# Patient Record
Sex: Female | Born: 1955 | Race: White | Hispanic: No | Marital: Married | Smoking: Never smoker
Health system: Southern US, Community
[De-identification: ages and names within clinical notes are randomized; demographics above are authoritative.]

## PROBLEM LIST (undated history)

## (undated) DIAGNOSIS — D329 Benign neoplasm of meninges, unspecified: Secondary | ICD-10-CM

## (undated) DIAGNOSIS — I10 Essential (primary) hypertension: Secondary | ICD-10-CM

## (undated) HISTORY — DX: Essential (primary) hypertension: I10

## (undated) HISTORY — DX: Benign neoplasm of meninges, unspecified: D32.9

## (undated) HISTORY — PX: WISDOM TOOTH EXTRACTION: SHX21

---

## 1994-06-06 HISTORY — PX: PARTIAL HYSTERECTOMY: SHX80

## 2004-06-06 DIAGNOSIS — D333 Benign neoplasm of cranial nerves: Secondary | ICD-10-CM | POA: Insufficient documentation

## 2004-06-06 HISTORY — DX: Benign neoplasm of cranial nerves: D33.3

## 2004-06-06 HISTORY — PX: ACOUSTIC NEUROMA RESECTION: SHX5713

## 2015-06-07 HISTORY — PX: TOTAL KNEE ARTHROPLASTY: SHX125

## 2015-12-18 DIAGNOSIS — J309 Allergic rhinitis, unspecified: Secondary | ICD-10-CM

## 2015-12-18 DIAGNOSIS — F32A Depression, unspecified: Secondary | ICD-10-CM | POA: Insufficient documentation

## 2015-12-18 DIAGNOSIS — R42 Dizziness and giddiness: Secondary | ICD-10-CM | POA: Insufficient documentation

## 2015-12-18 HISTORY — DX: Depression, unspecified: F32.A

## 2015-12-18 HISTORY — DX: Allergic rhinitis, unspecified: J30.9

## 2015-12-18 HISTORY — DX: Dizziness and giddiness: R42

## 2017-04-21 DIAGNOSIS — M1990 Unspecified osteoarthritis, unspecified site: Secondary | ICD-10-CM

## 2017-04-21 HISTORY — DX: Unspecified osteoarthritis, unspecified site: M19.90

## 2021-07-23 DIAGNOSIS — I361 Nonrheumatic tricuspid (valve) insufficiency: Secondary | ICD-10-CM

## 2021-08-25 NOTE — Progress Notes (Signed)
?Cardiology Office Note:   ? ?Date:  08/26/2021  ? ?ID:  Alyssa Ortiz, DOB 1956/01/10, MRN [id number] ? ?PCP:  Earlyne Iba, NP  ?Cardiologist:  Shirlee More, MD  ? ?Referring MD: Earlyne Iba, NP ? ?ASSESSMENT:   ? ?1. Bilateral lower extremity edema   ?2. Idiopathic pericardial effusion   ?3. Essential hypertension   ?4. Hyperlipidemia, unspecified hyperlipidemia type   ?5. Pre-procedure lab exam   ? ?PLAN:   ? ?In order of problems listed above: ? ?Clinical problem is lower extremity edema diagnosed as heart failure in the past not responsive to a loop diuretic.  Differential diagnosis includes heart failure diastolic, serial heart failure due to untreated sleep apnea or edema related to pericardial disease.  Clinically I do not think she has heart failure with normal BNP level and no characteristics of this at the time of her echocardiogram at [facility] to alleviate symptoms we will transition furosemide to torsemide I have asked her to proceed and have her CPAP titration and treatment to see if that resolves her peripheral edema and for evaluation of her idiopathic persistent pericardial effusion a cardiac MRI to define pericardial thickening or unsuspected cardiac infiltrative disease. ?Stable hypertension continue Ace inhibitor ?Continue her statin ? ?Next appointment 6 weeks ? ? ?Medication Adjustments/Labs and Tests Ordered: ?Current medicines are reviewed at length with the patient today.  Concerns regarding medicines are outlined above.  ?Orders Placed This Encounter  ?Procedures  ? MR CARDIAC MORPHOLOGY W WO CONTRAST  ? CBC with Differential/Platelet  ? EKG 12-Lead  ? ?Meds ordered this encounter  ?Medications  ? torsemide (DEMADEX) 20 MG tablet  ?  Sig: Take 1 tablet (20 mg total) by mouth daily.  ?  Dispense:  90 tablet  ?  Refill:  3  ?  ? ?Chief complaint: ?I have a history of heart failure and continue to have edema taking torsemide ? ?History of Present Illness:   ? ?Alyssa Ortiz is a 66 y.o. female with a history from chart review of hypertension and heart failure who is being seen today for the evaluation of heart failure at the request of Potts, Georgeann Oppenheim, NP.  Recent BNP level as an outpatient was quite low at 27.  She has a history of incidental meningioma and resection of acoustic neuroma in 2006 ? ?She had an echocardiogram [facility] 07/23/2021 small to moderate generalized pericardial effusion normal left ventricular systolic function mild LVH and grade 1 diastolic dysfunction. ? ?She had a treadmill stress test performed at [facility] 03/04/2019 which was normal EKG response 7.2 METS 92% of predicted heart rate and hypertensive blood pressure response ? ?She had an echocardiogram in 2 02/14/2019 showed mild LV dysfunction EF 45 to 50% with right coronary artery or left circumflex hypokinesia normal right ventricular size and function small pericardial effusion.  Pericardial effusion was noted as being persistent. ? ?In 2020 she had peripheral edema echocardiogram at [facility] and was told she had heart failure and left ventricular dysfunction.  She is not short of breath she has no known history of heart disease was put on a loop diuretic and it does not seem to be effective ? ?Recently she was having severe fatigue disordered sleep excessive daytime sleeping and was found to have obstructive sleep apnea is pending CPAP titration ? ?She has a idiopathic pericardial effusion small to moderate and no history of pericarditis pleuritis or rheumatologic disease ? ?She has no known heart disease congenital  rheumatic or atrial fibrillation ? ?Past Medical History:  ?Diagnosis Date  ? Hypertension   ? Meningioma ([place])   ? ? ?Past Surgical History:  ?Procedure Laterality Date  ? ACOUSTIC NEUROMA RESECTION  2006  ? PARTIAL HYSTERECTOMY  1996  ? TOTAL KNEE ARTHROPLASTY Right 2017  ? WISDOM TOOTH EXTRACTION    ? ? ?Current Medications: ?Current Meds  ?Medication Sig  ? aspirin EC 81 MG  tablet Take 81 mg by mouth daily. Swallow whole.  ? b complex vitamins capsule Take 1 capsule by mouth daily.  ? Cholecalciferol (D3-1000) 25 MCG (1000 UT) tablet Take 1,000 Units by mouth daily.  ? meloxicam (MOBIC) 15 MG tablet Take 15 mg by mouth daily as needed for pain.  ? potassium chloride (KLOR-CON) 10 MEQ tablet Take 10 mEq by mouth daily.  ? ramipril (ALTACE) 2.5 MG capsule Take 2.5 mg by mouth daily.  ? rosuvastatin (CRESTOR) 20 MG tablet Take 20 mg by mouth daily.  ? torsemide (DEMADEX) 20 MG tablet Take 1 tablet (20 mg total) by mouth daily.  ? VRAYLAR 1.5 MG capsule Take 1.5 mg by mouth daily.  ? [DISCONTINUED] furosemide (LASIX) 20 MG tablet Take 30 mg by mouth every morning.  ?  ? ?Allergies:   Patient has no known allergies.  ? ?Social History  ? ?Socioeconomic History  ? Marital status: Married  ?  Spouse name: Not on file  ? Number of children: Not on file  ? Years of education: Not on file  ? Highest education level: Not on file  ?Occupational History  ? Not on file  ?Tobacco Use  ? Smoking status: Never  ?  Passive exposure: Past  ? Smokeless tobacco: Never  ?Vaping Use  ? Vaping Use: Never used  ?Substance and Sexual Activity  ? Alcohol use: Yes  ? Drug use: Never  ? Sexual activity: Not on file  ?Other Topics Concern  ? Not on file  ?Social History Narrative  ? Not on file  ? ?Social Determinants of Health  ? ?Financial Resource Strain: Not on file  ?Food Insecurity: Not on file  ?Transportation Needs: Not on file  ?Physical Activity: Not on file  ?Stress: Not on file  ?Social Connections: Not on file  ?  ? ?Family History: ?The patient's family history includes Alzheimer's disease in her mother; Congestive Heart Failure in her maternal grandfather; Healthy in her father; Heart Problems in her maternal grandmother; Liver cancer in her paternal grandfather; Other (age of onset: 16) in her paternal grandmother. ? ?ROS:   ?ROS Please see the history of present illness.    ? All other systems  reviewed and are negative. ? ?EKGs/Labs/Other Studies Reviewed:   ? ?The following studies were reviewed today: ? ? ?EKG:  EKG is  ordered today.  The ekg ordered today is personally reviewed and demonstrates sinus rhythm late transition precordial leads otherwise normal EKG does not have low voltage no pattern of infarction or ischemia ? ?Recent Labs: ?07/21/2021 ?Cholesterol 172 LDL 89 triglycerides 87 HDL 67 A1c 5.6% hemoglobin 14.4 creatinine 0.85 potassium 3.9 ? ? ?Physical Exam:   ? ?VS:  BP 124/78 (BP Location: Right Arm)   Pulse 72   Ht '5\' 7"'$  (1.702 m)   Wt 228 lb 9.6 oz (103.7 kg)   SpO2 98%   BMI 35.80 kg/m?    ? ?Wt Readings from Last 3 Encounters:  ?08/26/21 228 lb 9.6 oz (103.7 kg)  ?  ? ?GEN: Obese BMI greater  than 35 she does not look chronically ill well nourished, well developed in no acute distress ?HEENT: Normal ?NECK: No JVD; No carotid bruits ?LYMPHATICS: No lymphadenopathy ?CARDIAC: RRR, no murmurs, rubs, gallops ?RESPIRATORY:  Clear to auscultation without rales, wheezing or rhonchi  ?ABDOMEN: Soft, non-tender, non-distended ?MUSCULOSKELETAL:  No edema; No deformity  ?SKIN: Warm and dry ?NEUROLOGIC:  Alert and oriented x 3 ?PSYCHIATRIC:  Normal affect  ? ? ? ?Signed, ?Shirlee More, MD  ?08/26/2021 5:22 PM    ?[place] ?

## 2021-08-26 ENCOUNTER — Other Ambulatory Visit: Payer: Self-pay

## 2021-08-26 ENCOUNTER — Ambulatory Visit: Payer: Federal, State, Local not specified - PPO | Admitting: Cardiology

## 2021-08-26 ENCOUNTER — Encounter: Payer: Self-pay | Admitting: Cardiology

## 2021-08-26 VITALS — BP 124/78 | HR 72 | Ht 67.0 in | Wt 228.6 lb

## 2021-08-26 DIAGNOSIS — I10 Essential (primary) hypertension: Secondary | ICD-10-CM

## 2021-08-26 DIAGNOSIS — I3139 Other pericardial effusion (noninflammatory): Secondary | ICD-10-CM

## 2021-08-26 DIAGNOSIS — Z01812 Encounter for preprocedural laboratory examination: Secondary | ICD-10-CM

## 2021-08-26 DIAGNOSIS — E785 Hyperlipidemia, unspecified: Secondary | ICD-10-CM | POA: Diagnosis not present

## 2021-08-26 DIAGNOSIS — R600 Localized edema: Secondary | ICD-10-CM

## 2021-08-26 HISTORY — DX: Other pericardial effusion (noninflammatory): I31.39

## 2021-08-26 MED ORDER — TORSEMIDE 20 MG PO TABS: 20.0000 mg | ORAL_TABLET | Freq: Every day | ORAL | 3 refills | Status: DC

## 2021-08-26 NOTE — Patient Instructions (Addendum)
Medication Instructions:  ?Your physician has recommended you make the following change in your medication:  ?Discontinue your Furosemide ?Start Torsemide 20 mg once daily ? ?*If you need a refill on your cardiac medications before your next appointment, please call your pharmacy* ? ? ?Lab Work: ?Your physician recommends that you return for lab work in: Today for [place] ? ?If you have labs (blood work) drawn today and your tests are completely normal, you will receive your results only by: ?MyChart Message (if you have MyChart) OR ?A paper copy in the mail ?If you have any lab test that is abnormal or we need to change your treatment, we will call you to review the results. ? ? ?Testing/Procedures: ??  ?[facility]  ?[address]  ?[place], [place] [address]  ?[phone]  ?Proceed to the [facility] Radiology Department (First Floor).  ??  ?Magnetic resonance imaging (MRI) is a painless test that produces images of the inside of the body without using X-rays. During an MRI, strong magnets and radio waves work together in a [employer], political party to form detailed images. MRI images may provide more details about a medical condition than X-rays, CT scans, and ultrasounds can provide.  ?You may be given earphones to listen for instructions.  ?You may eat a light breakfast and take medications as ordered with the exception of Torsemide (fluid pill, other). ?If a contrast material will be used, an IV will be inserted into one of your veins. Contrast material will be injected into your IV.  ?You will be asked to remove all metal, including: Watch, jewelry, and other metal objects including hearing aids, hair pieces and dentures. (Braces and fillings normally are not a problem.)  ?If contrast material was used:  ?It will leave your body through your urine within a day. You may be told to drink plenty of fluids to help flush the contrast material out of your system.  ?TEST WILL TAKE APPROXIMATELY 1 HOUR  ?PLEASE  NOTIFY SCHEDULING AT LEAST 24 HOURS IN ADVANCE IF YOU ARE UNABLE TO KEEP YOUR APPOINTMENT.   ? ? ?Follow-Up: ?At [facility], you and your health needs are our priority.  As part of our continuing mission to provide you with exceptional heart care, we have created designated Provider Care Teams.  These Care Teams include your primary Cardiologist (physician) and Advanced Practice Providers (APPs -  Physician Assistants and Nurse Practitioners) who all work together to provide you with the care you need, when you need it. ? ?We recommend signing up for the patient portal called "MyChart".  Sign up information is provided on this After Visit Summary.  MyChart is used to connect with patients for Virtual Visits (Telemedicine).  Patients are able to view lab/test results, encounter notes, upcoming appointments, etc.  Non-urgent messages can be sent to your provider as well.   ?To learn more about what you can do with MyChart, go to [email or web address].   ? ?Your next appointment:   ?8 week(s) ? ?The format for your next appointment:   ?In Person ? ?Provider:   ?Shirlee More, MD  ? ? ?Other Instructions ?  ?

## 2021-08-27 ENCOUNTER — Telehealth: Payer: Self-pay | Admitting: *Deleted

## 2021-08-27 LAB — CBC WITH DIFFERENTIAL/PLATELET
Basophils Absolute: 0.1 10*3/uL (ref 0.0–0.2)
Basos: 1 %
EOS (ABSOLUTE): 0.1 10*3/uL (ref 0.0–0.4)
Eos: 2 %
Hematocrit: 43.3 % (ref 34.0–46.6)
Hemoglobin: 14.4 g/dL (ref 11.1–15.9)
Immature Grans (Abs): 0 10*3/uL (ref 0.0–0.1)
Immature Granulocytes: 0 %
Lymphocytes Absolute: 1.8 10*3/uL (ref 0.7–3.1)
Lymphs: 33 %
MCH: 29.9 pg (ref 26.6–33.0)
MCHC: 33.3 g/dL (ref 31.5–35.7)
MCV: 90 fL (ref 79–97)
Monocytes Absolute: 0.4 10*3/uL (ref 0.1–0.9)
Monocytes: 8 %
Neutrophils Absolute: 2.9 10*3/uL (ref 1.4–7.0)
Neutrophils: 56 %
Platelets: 229 10*3/uL (ref 150–450)
RBC: 4.81 x10E6/uL (ref 3.77–5.28)
RDW: 11.8 % (ref 11.7–15.4)
WBC: 5.2 10*3/uL (ref 3.4–10.8)

## 2021-08-27 NOTE — Telephone Encounter (Signed)
Informed pt of normal lab results. She verbalized understanding. ?

## 2021-08-27 NOTE — Telephone Encounter (Signed)
-----   Message from Richardo Priest, MD sent at 08/27/2021  7:34 AM EDT ----- ?Normal or stable result ?

## 2021-09-16 ENCOUNTER — Telehealth (HOSPITAL_COMMUNITY): Payer: Self-pay | Admitting: *Deleted

## 2021-09-16 NOTE — Telephone Encounter (Signed)
Reaching out to patient to offer assistance regarding upcoming cardiac imaging study; pt verbalizes understanding of appt date/time, parking situation and where to check in, and medications ordered, and verified current allergies; name and call back number provided for further questions should they arise ? ?Alyssa Clement RN Navigator Cardiac Imaging ?[facility] ?[phone] office ?[phone] cell ? ?Patient states she has ativan that she will take for the test and her husband will be bringing her. Reports a marker in the breast from a previous biopsy. ?

## 2021-09-16 NOTE — Telephone Encounter (Signed)
Attempted to call patient regarding upcoming cardiac MRI appointment. Left message on voicemail with name and callback number  Donica Derouin RN Navigator Cardiac Imaging [facility] [phone] Office [phone] Cell  

## 2021-09-17 ENCOUNTER — Ambulatory Visit (HOSPITAL_COMMUNITY)
Admission: RE | Admit: 2021-09-17 | Discharge: 2021-09-17 | Disposition: A | Discharge status: Home / Self Care | Payer: Federal, State, Local not specified - PPO | Source: Ambulatory Visit | Attending: Cardiology | Admitting: Cardiology

## 2021-09-17 DIAGNOSIS — I3139 Other pericardial effusion (noninflammatory): Secondary | ICD-10-CM

## 2021-09-17 MED ORDER — GADOBUTROL 1 MMOL/ML IV SOLN
10.0000 mL | Freq: Once | INTRAVENOUS | Status: AC | PRN
  Administered 2021-09-17: 10 mL via INTRAVENOUS

## 2021-09-22 DIAGNOSIS — D329 Benign neoplasm of meninges, unspecified: Secondary | ICD-10-CM | POA: Insufficient documentation

## 2021-09-22 DIAGNOSIS — I10 Essential (primary) hypertension: Secondary | ICD-10-CM | POA: Insufficient documentation

## 2021-09-23 ENCOUNTER — Encounter: Payer: Self-pay | Admitting: Cardiology

## 2021-09-23 ENCOUNTER — Telehealth: Payer: Self-pay | Admitting: Physician Assistant

## 2021-09-23 ENCOUNTER — Ambulatory Visit: Payer: Federal, State, Local not specified - PPO | Admitting: Cardiology

## 2021-09-23 VITALS — BP 112/78 | HR 68 | Ht 67.0 in | Wt 226.1 lb

## 2021-09-23 DIAGNOSIS — I10 Essential (primary) hypertension: Secondary | ICD-10-CM

## 2021-09-23 DIAGNOSIS — Z01812 Encounter for preprocedural laboratory examination: Secondary | ICD-10-CM

## 2021-09-23 DIAGNOSIS — I3139 Other pericardial effusion (noninflammatory): Secondary | ICD-10-CM | POA: Diagnosis not present

## 2021-09-23 NOTE — Telephone Encounter (Signed)
Patient has a stable large pericardial effusion. Dr. Bettina Gavia spoke to Dr. Kipp Brood and Dr. Ali Lowe and plan is for pericardiocentesis. Needs updated echo (last one done at [facility]) and an apt with Dr. Ali Lowe. Will send to [place] his office RN to set up.  ? ?

## 2021-09-23 NOTE — Telephone Encounter (Signed)
Forwarded to [employer].  ?

## 2021-09-23 NOTE — Progress Notes (Addendum)
?Cardiology Office Note:   ? ?Date:  09/23/2021  ? ?ID:  Alyssa Ortiz, DOB January 14, 1956, MRN [id number] ? ?PCP:  Earlyne Iba, NP  ?Cardiologist:  Shirlee More, MD   ? ?Referring MD: Earlyne Iba, NP  ? ? ?ASSESSMENT:   ? ?1. Idiopathic pericardial effusion   ?2. Essential hypertension   ?3. Pre-procedure lab exam   ? ?PLAN:   ? ?In order of problems listed above: ? ?With the benefit of her MRI I do not think her problem is heart failure I think she has pericardial effusion chronic idiopathic and with her fatigue and peripheral edema define her is symptomatic although she does not have ventricular interdependence Kussmaul sign or pulses paradoxus she does have neck vein distention.  I think she would benefit from drainage.  I will send a copy of my note to CT surgeon and ask if they feel she is best served by window as opposed to guided pericardiocentesis.  She wants to go to [facility] for a week and I can think of a reason not to.  There is no discrete etiology she has no TB exposure she has had normal skin test in the past she may have a rheumatologic disease with prominent joint complaints I will screen with CRP sedimentation rate I told her she will need to see a rheumatologist.  She is agreeable ?Stable continue current treatment ? ? ?Next appointment: 3 months or sooner ? ? ?Medication Adjustments/Labs and Tests Ordered: ?Current medicines are reviewed at length with the patient today.  Concerns regarding medicines are outlined above.  ?Orders Placed This Encounter  ?Procedures  ? TSH+T4F+T3Free  ? CRP High sensitivity  ? Sed Rate (ESR)  ? ?No orders of the defined types were placed in this encounter. ? ? ?Complaint follow-up after cardiac MRI she has a large effusion and pericardial thickening ? ? ?History of Present Illness:   ? ?Alyssa Ortiz is a 66 y.o. female with a hx of chronic pericardial effusion hypertension and hyperlipidemia last seen 08/26/2020 with lower extremity edema previously  diagnosed as heart failure. ?Prior to seeing me in the office she had an echocardiogram performed at [facility]. ?She had an echocardiogram [facility] 07/23/2021 small to moderate generalized pericardial effusion normal left ventricular systolic function mild LVH and grade 1 diastolic dysfunction.  ? ?She had a treadmill stress test performed at [facility] 03/04/2019 which was normal EKG response 7.2 METS 92% of predicted heart rate and hypertensive blood pressure response ?  ?She had an echocardiogram in 2 02/14/2019 showed mild LV dysfunction EF 45 to 50% with right coronary artery or left circumflex hypokinesia normal right ventricular size and function small pericardial effusion.  Pericardial effusion was noted as being persistent. ?  ?In 2020 she had peripheral edema echocardiogram at [facility] and was told she had heart failure and left ventricular dysfunction.  She is not short of breath she has no known history of heart disease was put on a loop diuretic and it does not seem to be effective ? ?Compliance with diet, lifestyle and medications: Yes ? ?Her husband is present I reviewed the results of her cardiac MR ?Pericardium is thickened she has a large effusion ?She has chronic edema and she feels quite fatigued ?I would define her pericardial effusion is chronic large and symptomatic ?She also has prominent joint complaints both large joints in small joints and has not been seen by a rheumatologist ?She is not having chest pain shortness of  breath or syncope ?She has no pulses paradoxus on physical examination but she does have moderate neck vein distention and peripheral edema ?I advised that she has drainage I will reach out and speak to my CT surgeon as well as interventional cardiology but reviewing up-to-date the recommendation is percutaneous drainage first ?I will recheck her thyroid it was normal in May 2022 and also check both CRP and sedimentation rate but I told her she will need to see a  rheumatologist ?She plans a trip for 1 week to [place] leaving Sunday and I think she can do it ? ?She underwent a cardiac MR reported 09/19/2021 left ventricle is normal in size function EF 61% no findings of fibrosis or late gadolinium enhancement right ventricle normal size and function significant finding included pericardial thickening 4 mm and a large pericardial effusion largest dimension 2.6 cm over the anterior pericardium and it was circumferential.  There is no evidence of ventricular anterior dependence. ? ?I interacted with our CT surgeon Dr. Kipp Brood and Dr. Ali Lowe and we will arrange for her to have outpatient heart catheterization and pericardiocentesis. ?I phoned [name] and her husband and told her she be contacted by cardiology to be scheduled for the procedure ?She will stop her ACE inhibitor I do not think there is a reason to take it ?And I think she can do her trip to [place] and schedule afterwards. ? ?Past Medical History:  ?Diagnosis Date  ? Hypertension   ? Meningioma ([place])   ? ? ?Past Surgical History:  ?Procedure Laterality Date  ? ACOUSTIC NEUROMA RESECTION  2006  ? PARTIAL HYSTERECTOMY  1996  ? TOTAL KNEE ARTHROPLASTY Right 2017  ? WISDOM TOOTH EXTRACTION    ? ? ?Current Medications: ?Current Meds  ?Medication Sig  ? aspirin EC 81 MG tablet Take 81 mg by mouth daily. Swallow whole.  ? b complex vitamins capsule Take 1 capsule by mouth daily.  ? buPROPion (WELLBUTRIN SR) 150 MG 12 hr tablet Take 150 mg by mouth daily.  ? buPROPion (WELLBUTRIN XL) 300 MG 24 hr tablet Take 300 mg by mouth daily.  ? Cholecalciferol (D3-1000) 25 MCG (1000 UT) tablet Take 1,000 Units by mouth daily.  ? meloxicam (MOBIC) 15 MG tablet Take 15 mg by mouth daily as needed for pain.  ? potassium chloride (KLOR-CON) 10 MEQ tablet Take 10 mEq by mouth daily.  ? ramipril (ALTACE) 2.5 MG capsule Take 2.5 mg by mouth daily.  ? rosuvastatin (CRESTOR) 20 MG tablet Take 20 mg by mouth daily.  ? torsemide (DEMADEX) 20  MG tablet Take 1 tablet (20 mg total) by mouth daily.  ? VRAYLAR 1.5 MG capsule Take 1.5 mg by mouth daily.  ?  ? ?Allergies:   Patient has no known allergies.  ? ?Social History  ? ?Socioeconomic History  ? Marital status: Married  ?  Spouse name: Not on file  ? Number of children: Not on file  ? Years of education: Not on file  ? Highest education level: Not on file  ?Occupational History  ? Not on file  ?Tobacco Use  ? Smoking status: Never  ?  Passive exposure: Past  ? Smokeless tobacco: Never  ?Vaping Use  ? Vaping Use: Never used  ?Substance and Sexual Activity  ? Alcohol use: Yes  ? Drug use: Never  ? Sexual activity: Not on file  ?Other Topics Concern  ? Not on file  ?Social History Narrative  ? Not on file  ? ?  Social Determinants of Health  ? ?Financial Resource Strain: Not on file  ?Food Insecurity: Not on file  ?Transportation Needs: Not on file  ?Physical Activity: Not on file  ?Stress: Not on file  ?Social Connections: Not on file  ?  ? ?Family History: ?The patient's family history includes Alzheimer's disease in her mother; Congestive Heart Failure in her maternal grandfather; Healthy in her father; Heart Problems in her maternal grandmother; Liver cancer in her paternal grandfather; Other (age of onset: 17) in her paternal grandmother. ?ROS:   ?Please see the history of present illness.    ?All other systems reviewed and are negative. ? ?EKGs/Labs/Other Studies Reviewed:   ? ?The following studies were reviewed today: ? ? ? ?Recent Labs: ?08/26/2021: Hemoglobin 14.4; Platelets 229  ?Recent Lipid Panel ?No results found for: CHOL, TRIG, HDL, CHOLHDL, VLDL, LDLCALC, LDLDIRECT ? ?Physical Exam:   ? ?VS:  BP 112/78 (BP Location: Right Arm)   Pulse 68   Ht 5' 7" (1.702 m)   Wt 226 lb 1.3 oz (102.5 kg)   SpO2 96%   BMI 35.41 kg/m?    ? ?Wt Readings from Last 3 Encounters:  ?09/23/21 226 lb 1.3 oz (102.5 kg)  ?08/26/21 228 lb 9.6 oz (103.7 kg)  ?  ? ?GEN: Does not look chronically ill well nourished,  well developed in no acute distress ?HEENT: Normal ?NECK: No JVD; No carotid bruits ?LYMPHATICS: No lymphadenopathy ?CARDIAC: RRR, no murmurs, rubs, gallops ?RESPIRATORY:  Clear to auscultation without rales, whe

## 2021-09-23 NOTE — Patient Instructions (Signed)
Medication Instructions:  ?Your physician recommends that you continue on your current medications as directed. Please refer to the Current Medication list given to you today. ? ?*If you need a refill on your cardiac medications before your next appointment, please call your pharmacy* ? ? ?Lab Work: ?Your physician recommends that you return for lab work in:  ? ?Labs today in [address]: TSH T3 T4, CRP, Sed Rate ? ?If you have labs (blood work) drawn today and your tests are completely normal, you will receive your results only by: ?MyChart Message (if you have MyChart) OR ?A paper copy in the mail ?If you have any lab test that is abnormal or we need to change your treatment, we will call you to review the results. ? ? ?Testing/Procedures: ?None ? ? ?Follow-Up: ?At [facility], you and your health needs are our priority.  As part of our continuing mission to provide you with exceptional heart care, we have created designated Provider Care Teams.  These Care Teams include your primary Cardiologist (physician) and Advanced Practice Providers (APPs -  Physician Assistants and Nurse Practitioners) who all work together to provide you with the care you need, when you need it. ? ?We recommend signing up for the patient portal called "MyChart".  Sign up information is provided on this After Visit Summary.  MyChart is used to connect with patients for Virtual Visits (Telemedicine).  Patients are able to view lab/test results, encounter notes, upcoming appointments, etc.  Non-urgent messages can be sent to your provider as well.   ?To learn more about what you can do with MyChart, go to [email or web address].   ? ?Your next appointment:   ?3 month(s) ? ?The format for your next appointment:   ?In Person ? ?Provider:   ?Shirlee More, MD  ? ? ?Other Instructions ?None ? ?Important Information About Sugar ? ? ? ? ? ? ?

## 2021-09-23 NOTE — H&P (View-Only) (Signed)
?Cardiology Office Note:   ? ?Date:  09/23/2021  ? ?ID:  Alyssa Ortiz, DOB January 14, 1956, MRN [id number] ? ?PCP:  Earlyne Iba, NP  ?Cardiologist:  Shirlee More, MD   ? ?Referring MD: Earlyne Iba, NP  ? ? ?ASSESSMENT:   ? ?1. Idiopathic pericardial effusion   ?2. Essential hypertension   ?3. Pre-procedure lab exam   ? ?PLAN:   ? ?In order of problems listed above: ? ?With the benefit of her MRI I do not think her problem is heart failure I think she has pericardial effusion chronic idiopathic and with her fatigue and peripheral edema define her is symptomatic although she does not have ventricular interdependence Kussmaul sign or pulses paradoxus she does have neck vein distention.  I think she would benefit from drainage.  I will send a copy of my note to CT surgeon and ask if they feel she is best served by window as opposed to guided pericardiocentesis.  She wants to go to [facility] for a week and I can think of a reason not to.  There is no discrete etiology she has no TB exposure she has had normal skin test in the past she may have a rheumatologic disease with prominent joint complaints I will screen with CRP sedimentation rate I told her she will need to see a rheumatologist.  She is agreeable ?Stable continue current treatment ? ? ?Next appointment: 3 months or sooner ? ? ?Medication Adjustments/Labs and Tests Ordered: ?Current medicines are reviewed at length with the patient today.  Concerns regarding medicines are outlined above.  ?Orders Placed This Encounter  ?Procedures  ? TSH+T4F+T3Free  ? CRP High sensitivity  ? Sed Rate (ESR)  ? ?No orders of the defined types were placed in this encounter. ? ? ?Complaint follow-up after cardiac MRI she has a large effusion and pericardial thickening ? ? ?History of Present Illness:   ? ?Alyssa Ortiz is a 66 y.o. female with a hx of chronic pericardial effusion hypertension and hyperlipidemia last seen 08/26/2020 with lower extremity edema previously  diagnosed as heart failure. ?Prior to seeing me in the office she had an echocardiogram performed at [facility]. ?She had an echocardiogram [facility] 07/23/2021 small to moderate generalized pericardial effusion normal left ventricular systolic function mild LVH and grade 1 diastolic dysfunction.  ? ?She had a treadmill stress test performed at [facility] 03/04/2019 which was normal EKG response 7.2 METS 92% of predicted heart rate and hypertensive blood pressure response ?  ?She had an echocardiogram in 2 02/14/2019 showed mild LV dysfunction EF 45 to 50% with right coronary artery or left circumflex hypokinesia normal right ventricular size and function small pericardial effusion.  Pericardial effusion was noted as being persistent. ?  ?In 2020 she had peripheral edema echocardiogram at [facility] and was told she had heart failure and left ventricular dysfunction.  She is not short of breath she has no known history of heart disease was put on a loop diuretic and it does not seem to be effective ? ?Compliance with diet, lifestyle and medications: Yes ? ?Her husband is present I reviewed the results of her cardiac MR ?Pericardium is thickened she has a large effusion ?She has chronic edema and she feels quite fatigued ?I would define her pericardial effusion is chronic large and symptomatic ?She also has prominent joint complaints both large joints in small joints and has not been seen by a rheumatologist ?She is not having chest pain shortness of  breath or syncope ?She has no pulses paradoxus on physical examination but she does have moderate neck vein distention and peripheral edema ?I advised that she has drainage I will reach out and speak to my CT surgeon as well as interventional cardiology but reviewing up-to-date the recommendation is percutaneous drainage first ?I will recheck her thyroid it was normal in May 2022 and also check both CRP and sedimentation rate but I told her she will need to see a  rheumatologist ?She plans a trip for 1 week to [place] leaving Sunday and I think she can do it ? ?She underwent a cardiac MR reported 09/19/2021 left ventricle is normal in size function EF 61% no findings of fibrosis or late gadolinium enhancement right ventricle normal size and function significant finding included pericardial thickening 4 mm and a large pericardial effusion largest dimension 2.6 cm over the anterior pericardium and it was circumferential.  There is no evidence of ventricular anterior dependence. ? ?I interacted with our CT surgeon Dr. Kipp Brood and Dr. Ali Lowe and we will arrange for her to have outpatient heart catheterization and pericardiocentesis. ?I phoned [name] and her husband and told her she be contacted by cardiology to be scheduled for the procedure ?She will stop her ACE inhibitor I do not think there is a reason to take it ?And I think she can do her trip to [place] and schedule afterwards. ? ?Past Medical History:  ?Diagnosis Date  ? Hypertension   ? Meningioma ([place])   ? ? ?Past Surgical History:  ?Procedure Laterality Date  ? ACOUSTIC NEUROMA RESECTION  2006  ? PARTIAL HYSTERECTOMY  1996  ? TOTAL KNEE ARTHROPLASTY Right 2017  ? WISDOM TOOTH EXTRACTION    ? ? ?Current Medications: ?Current Meds  ?Medication Sig  ? aspirin EC 81 MG tablet Take 81 mg by mouth daily. Swallow whole.  ? b complex vitamins capsule Take 1 capsule by mouth daily.  ? buPROPion (WELLBUTRIN SR) 150 MG 12 hr tablet Take 150 mg by mouth daily.  ? buPROPion (WELLBUTRIN XL) 300 MG 24 hr tablet Take 300 mg by mouth daily.  ? Cholecalciferol (D3-1000) 25 MCG (1000 UT) tablet Take 1,000 Units by mouth daily.  ? meloxicam (MOBIC) 15 MG tablet Take 15 mg by mouth daily as needed for pain.  ? potassium chloride (KLOR-CON) 10 MEQ tablet Take 10 mEq by mouth daily.  ? ramipril (ALTACE) 2.5 MG capsule Take 2.5 mg by mouth daily.  ? rosuvastatin (CRESTOR) 20 MG tablet Take 20 mg by mouth daily.  ? torsemide (DEMADEX) 20  MG tablet Take 1 tablet (20 mg total) by mouth daily.  ? VRAYLAR 1.5 MG capsule Take 1.5 mg by mouth daily.  ?  ? ?Allergies:   Patient has no known allergies.  ? ?Social History  ? ?Socioeconomic History  ? Marital status: Married  ?  Spouse name: Not on file  ? Number of children: Not on file  ? Years of education: Not on file  ? Highest education level: Not on file  ?Occupational History  ? Not on file  ?Tobacco Use  ? Smoking status: Never  ?  Passive exposure: Past  ? Smokeless tobacco: Never  ?Vaping Use  ? Vaping Use: Never used  ?Substance and Sexual Activity  ? Alcohol use: Yes  ? Drug use: Never  ? Sexual activity: Not on file  ?Other Topics Concern  ? Not on file  ?Social History Narrative  ? Not on file  ? ?  Social Determinants of Health  ? ?Financial Resource Strain: Not on file  ?Food Insecurity: Not on file  ?Transportation Needs: Not on file  ?Physical Activity: Not on file  ?Stress: Not on file  ?Social Connections: Not on file  ?  ? ?Family History: ?The patient's family history includes Alzheimer's disease in her mother; Congestive Heart Failure in her maternal grandfather; Healthy in her father; Heart Problems in her maternal grandmother; Liver cancer in her paternal grandfather; Other (age of onset: 17) in her paternal grandmother. ?ROS:   ?Please see the history of present illness.    ?All other systems reviewed and are negative. ? ?EKGs/Labs/Other Studies Reviewed:   ? ?The following studies were reviewed today: ? ? ? ?Recent Labs: ?08/26/2021: Hemoglobin 14.4; Platelets 229  ?Recent Lipid Panel ?No results found for: CHOL, TRIG, HDL, CHOLHDL, VLDL, LDLCALC, LDLDIRECT ? ?Physical Exam:   ? ?VS:  BP 112/78 (BP Location: Right Arm)   Pulse 68   Ht 5' 7" (1.702 m)   Wt 226 lb 1.3 oz (102.5 kg)   SpO2 96%   BMI 35.41 kg/m?    ? ?Wt Readings from Last 3 Encounters:  ?09/23/21 226 lb 1.3 oz (102.5 kg)  ?08/26/21 228 lb 9.6 oz (103.7 kg)  ?  ? ?GEN: Does not look chronically ill well nourished,  well developed in no acute distress ?HEENT: Normal ?NECK: No JVD; No carotid bruits ?LYMPHATICS: No lymphadenopathy ?CARDIAC: RRR, no murmurs, rubs, gallops ?RESPIRATORY:  Clear to auscultation without rales, whe

## 2021-09-24 LAB — SEDIMENTATION RATE: Sed Rate: 4 mm/hr (ref 0–40)

## 2021-09-24 LAB — TSH+T4F+T3FREE
Free T4: 1.61 ng/dL (ref 0.82–1.77)
T3, Free: 3.1 pg/mL (ref 2.0–4.4)
TSH: 3.06 u[IU]/mL (ref 0.450–4.500)

## 2021-09-24 LAB — HIGH SENSITIVITY CRP: CRP, High Sensitivity: 0.67 mg/L (ref 0.00–3.00)

## 2021-09-24 NOTE — Telephone Encounter (Signed)
Per Dr. Ali Lowe, pt will need pericardiocentesis in cath lab on 10/13/21, with echo prior at hospital when she comes that morning.  ? ? ?

## 2021-10-01 NOTE — Addendum Note (Signed)
Addended by: Rodman Key on: 10/01/2021 01:04 PM ? ? Modules accepted: Orders ? ?

## 2021-10-01 NOTE — Telephone Encounter (Addendum)
The patient has been scheduled for procedure in cath lab 10/13/21.  1:30 pm case. To arrive 11:30 am.  Echo order placed for pre procedure.  Spoke with echo Museum/gallery conservator, Melissa who is aware of the plan and able to arrange for holding area echo. ? ?Left VM for patient lettering her know of the date/time and that we will send her instruction through [facility]. ?Her labs and ekg will be out of date.  Message to Dr. Joya Gaskins nurse to see if these can be arranged for one day next week at the [facility] office. ? ?

## 2021-10-05 NOTE — Telephone Encounter (Signed)
Left VM for patient to call back to schedule. CB ?

## 2021-10-06 ENCOUNTER — Other Ambulatory Visit: Payer: Self-pay | Admitting: *Deleted

## 2021-10-06 DIAGNOSIS — I3139 Other pericardial effusion (noninflammatory): Secondary | ICD-10-CM

## 2021-10-06 DIAGNOSIS — Z01812 Encounter for preprocedural laboratory examination: Secondary | ICD-10-CM

## 2021-10-06 NOTE — Addendum Note (Signed)
Addended by: Katrine Coho on: 10/06/2021 08:30 AM ? ? Modules accepted: Orders ? ?

## 2021-10-07 ENCOUNTER — Ambulatory Visit (INDEPENDENT_AMBULATORY_CARE_PROVIDER_SITE_OTHER): Payer: Federal, State, Local not specified - PPO | Admitting: Cardiology

## 2021-10-07 ENCOUNTER — Telehealth: Payer: Self-pay

## 2021-10-07 VITALS — BP 120/72 | HR 70 | Ht 67.0 in | Wt 230.0 lb

## 2021-10-07 DIAGNOSIS — Z01818 Encounter for other preprocedural examination: Secondary | ICD-10-CM

## 2021-10-07 LAB — CBC
Hematocrit: 40.1 % (ref 34.0–46.6)
Hemoglobin: 13.8 g/dL (ref 11.1–15.9)
MCH: 30.3 pg (ref 26.6–33.0)
MCHC: 34.4 g/dL (ref 31.5–35.7)
MCV: 88 fL (ref 79–97)
Platelets: 203 10*3/uL (ref 150–450)
RBC: 4.56 x10E6/uL (ref 3.77–5.28)
RDW: 11.9 % (ref 11.7–15.4)
WBC: 4.1 10*3/uL (ref 3.4–10.8)

## 2021-10-07 NOTE — Telephone Encounter (Signed)
Spoke to the patient, will stop by tomorrow for BMP. Order on file ?

## 2021-10-07 NOTE — Telephone Encounter (Signed)
LM to return my call . Patient need BMP for cath pre-procedure  ?

## 2021-10-07 NOTE — Progress Notes (Signed)
? ?  Nurse Visit  ? ?Date of Encounter: 10/07/2021 ?ID: Alyssa Ortiz, DOB Mar 02, 1956, MRN [id number] ? ?PCP:  Earlyne Iba, NP ?  ?[facility] Providers ?Cardiologist:  None    ? ? ?Visit Details  ? ?VS:  BP 120/72 (BP Location: Left Arm, Patient Position: Sitting)   Pulse 70   Ht '5\' 7"'$  (1.702 m)   Wt 230 lb (104.3 kg)   SpO2 96%   BMI 36.02 kg/m?  , BMI Body mass index is 36.02 kg/m?. ? ?Wt Readings from Last 3 Encounters:  ?10/07/21 230 lb (104.3 kg)  ?09/23/21 226 lb 1.3 oz (102.5 kg)  ?08/26/21 228 lb 9.6 oz (103.7 kg)  ?  ? ?Reason for visit: Prep for Cardiac Cath ?Performed today: CBC, BMET , Vitals, EKG, Provider consulted: Dr. Bettina Gavia, and Education ?Changes (medications, testing, etc.) : None ?Length of Visit: 30 minutes ? ? ? ?Medications Adjustments/Labs and Tests Ordered: ?Orders Placed This Encounter  ?Procedures  ? EKG 12-Lead  ? ?No orders of the defined types were placed in this encounter. ? ?Pt came in office as directed by MD to prepare for Cardiac Cath on 10-13-21.  ? ? ?Signed, ?Tyler Pita, RN  ?10/07/2021 9:16 AM  ?

## 2021-10-08 LAB — BASIC METABOLIC PANEL
BUN/Creatinine Ratio: 15 (ref 12–28)
BUN: 13 mg/dL (ref 8–27)
CO2: 29 mmol/L (ref 20–29)
Calcium: 9 mg/dL (ref 8.7–10.3)
Chloride: 100 mmol/L (ref 96–106)
Creatinine, Ser: 0.86 mg/dL (ref 0.57–1.00)
Glucose: 91 mg/dL (ref 70–99)
Potassium: 3.6 mmol/L (ref 3.5–5.2)
Sodium: 139 mmol/L (ref 134–144)
eGFR: 74 mL/min/{1.73_m2} (ref >59)

## 2021-10-12 ENCOUNTER — Telehealth: Payer: Self-pay | Admitting: *Deleted

## 2021-10-12 NOTE — Telephone Encounter (Signed)
Left detailed message (DPR) with procedure instructions on cell phone voicemail.  ?

## 2021-10-12 NOTE — Telephone Encounter (Signed)
Call placed to patient to review procedure instructions, no answer, voicemail message. °

## 2021-10-12 NOTE — Telephone Encounter (Signed)
Spoke with patient and reviewed instructions.

## 2021-10-12 NOTE — Telephone Encounter (Signed)
Pericardiocentesis scheduled at [facility] for: Wednesday Oct 13, 2021 1:30 PM ?Arrival time and place: [facility] A at: 11:30 AM-needs echo prior to procedure-echo department aware ? ? ?Nothing to eat after midnight prior to procedure, clear liquids until 5 AM day of procedure. ? ?Medication instructions: ?-Hold: ? Torsemide/KCl/aspirin-AM of procedure per instructions 10/08/21 ?-Except hold medications usual morning medications can be taken with sips of water. ? ?Confirmed patient has responsible adult to drive home post procedure and be with patient first 24 hours after arriving home. ? ?Patient reports no new symptoms concerning for COVID-19/no exposure to COVID-19 in the past 10 days. ? ?Left message to call back to review procedure instructions. ?

## 2021-10-13 ENCOUNTER — Ambulatory Visit (HOSPITAL_COMMUNITY): Payer: Federal, State, Local not specified - PPO

## 2021-10-13 ENCOUNTER — Other Ambulatory Visit: Payer: Self-pay

## 2021-10-13 ENCOUNTER — Encounter (HOSPITAL_COMMUNITY)
Admission: RE | Disposition: A | Discharge status: Home / Self Care | Payer: Self-pay | Source: Home / Self Care | Attending: Internal Medicine

## 2021-10-13 ENCOUNTER — Encounter: Payer: Self-pay | Admitting: Cardiology

## 2021-10-13 ENCOUNTER — Ambulatory Visit (HOSPITAL_COMMUNITY)
Admission: RE | Admit: 2021-10-13 | Discharge: 2021-10-13 | Disposition: A | Discharge status: Home / Self Care | Payer: Federal, State, Local not specified - PPO | Attending: Internal Medicine | Admitting: Internal Medicine

## 2021-10-13 ENCOUNTER — Ambulatory Visit (HOSPITAL_BASED_OUTPATIENT_CLINIC_OR_DEPARTMENT_OTHER): Payer: Federal, State, Local not specified - PPO

## 2021-10-13 ENCOUNTER — Encounter (HOSPITAL_COMMUNITY): Payer: Self-pay

## 2021-10-13 DIAGNOSIS — I3139 Other pericardial effusion (noninflammatory): Secondary | ICD-10-CM | POA: Diagnosis present

## 2021-10-13 DIAGNOSIS — E785 Hyperlipidemia, unspecified: Secondary | ICD-10-CM | POA: Insufficient documentation

## 2021-10-13 DIAGNOSIS — I10 Essential (primary) hypertension: Secondary | ICD-10-CM | POA: Diagnosis not present

## 2021-10-13 LAB — ECHOCARDIOGRAM LIMITED
Height: 67 in
Weight: 3600 oz

## 2021-10-13 SURGERY — INVASIVE LAB ABORTED CASE

## 2021-10-13 MED ORDER — SODIUM CHLORIDE 0.9 % IV SOLN: INTRAVENOUS | Status: DC

## 2021-10-13 SURGICAL SUPPLY — 4 items
PACK CARDIAC CATHETERIZATION (CUSTOM PROCEDURE TRAY) ×2 IMPLANT
SHEATH PROBE COVER 6X72 (BAG) ×2 IMPLANT
TRAY PERICARDIOCENTESIS 6FX60 (TRAY / TRAY PROCEDURE) ×2 IMPLANT
WIRE MICRO SET SILHO 5FR 7 (SHEATH) ×2 IMPLANT

## 2021-10-13 NOTE — Progress Notes (Signed)
Client and her husband advised to call Dr if any problems,questions or concerns and they voiced understanding ?

## 2021-10-13 NOTE — Progress Notes (Signed)
?  Echocardiogram ?2D Echocardiogram has been performed. ? ?Alyssa Ortiz ?10/13/2021, 5:26 PM ?

## 2021-10-13 NOTE — Interval H&P Note (Signed)
History and Physical Interval Note: ? ?10/13/2021 ?2:26 PM ? ?Alyssa Ortiz  has presented today for surgery, with the diagnosis of pericardial effusion.  The various methods of treatment have been discussed with the patient and family. After consideration of risks, benefits and other options for treatment, the patient has consented to  Procedure(s): ?PERICARDIOCENTESIS (N/A) as a surgical intervention.  The patient's history has been reviewed, patient examined, no change in status, stable for surgery.  I have reviewed the patient's chart and labs.  Questions were answered to the patient's satisfaction.   ? ? ?Early Osmond ? ? ?

## 2021-10-13 NOTE — Progress Notes (Signed)
The patient was referred for planned pericardiocentesis due to a large pericardial effusion seen on MRI.  Echocardiogram here demonstrated a mild to moderate effusion with no evidence of tamponade.  Given the patient's lack of significant symptoms the decision was made to defer pericardiocentesis at this time. ?

## 2021-10-13 NOTE — Interval H&P Note (Signed)
History and Physical Interval Note: ? ?10/13/2021 ?4:28 PM ? ?Alyssa Ortiz  has presented today for surgery, with the diagnosis of pericardial effusion.  The various methods of treatment have been discussed with the patient and family. After consideration of risks, benefits and other options for treatment, the patient has consented to  Procedure(s): ?PERICARDIOCENTESIS (N/A) as a surgical intervention.  The patient's history has been reviewed, patient examined, no change in status, stable for surgery.  I have reviewed the patient's chart and labs.  Questions were answered to the patient's satisfaction.   ? ? ?Early Osmond ? ? ?

## 2021-11-02 NOTE — Progress Notes (Unsigned)
Cardiology Office Note: 3   Date:  11/03/2021   ID:  Alyssa Ortiz, DOB [birth year], MRN [id number]  PCP:  Alyssa Iba, NP  Cardiologist:  Alyssa More, MD    Referring MD: Alyssa Iba, NP    ASSESSMENT:    1. Pericardial effusion   2. Essential hypertension    PLAN:    In order of problems listed above:  She clearly has chronic pericardial disease with effusion if anything is diminished between the last 2 has no compressive physiology and really no indication for surgical or percutaneous drainage.  I am at a loss to explain the discrepancy on MRI which describes it as being large and the echo appearance small moderate regardless did not require drainage and does not have markers of ongoing inflammation or effusive constrictive physiology she will continue her low-dose diuretic.  I did ask her to have a noncontrast CT of the chest for completeness to exclude other thoracic pathology Stable no longer taking ACE inhibitor   Next appointment: 1 year   Medication Adjustments/Labs and Tests Ordered: Current medicines are reviewed at length with the patient today.  Concerns regarding medicines are outlined above.  No orders of the defined types were placed in this encounter.  No orders of the defined types were placed in this encounter.   Follow-up after echocardiogram pericardiocentesis was canceled   History of Present Illness:    Alyssa Ortiz is a 66 y.o. female with a hx of chronic pericardial effusion hypertension and hyperlipidemia last seen 08/26/2020 with lower extremity edema previously diagnosed as heart failure. Prior to seeing me in the office she had an echocardiogram performed at [facility].  She had an echocardiogram [facility] 07/23/2021 small to moderate generalized pericardial effusion normal left ventricular systolic function mild LVH and grade 1 diastolic dysfunction last seen 09/23/2021.   Cardiac MRI 09/17/2021: No  pericardial enhancement. There is mild pericardial thickening pericardium- 4 mm on black blood imaging overlying the anterior RV pericardium.  She had no late gadolinium enhancement.   There is a large pericardial effusion. Largest over the basal anterior LV 26 mm. Circumferential effusion.   There is no evidence of ventricular interdependence. There is no hepatic flow reversal. There is no evidence RV or RA collapse. There is no IVC dilation.  Echo 10/13/2021:  1. Moderate pericardial effusion measuring 1.6cm at end diastole at max  diameter around the RV free wall. The pericardial effusion is  circumferential. The IVC is normal in size and collapsible. There is no  evidence of cardiac tamponade.   2. There is a prominent fat pad around the RV, however, the pericardium also appears thickened. No evidence of constriction (no ventricular  interdependence visualized, lateral e' is normal, and IVC is normal in  size).   3. Left ventricular ejection fraction, by estimation, is 60 to 65%. The  left ventricle has normal function.   4. The inferior vena cava is normal in size with greater than 50%  respiratory variability, suggesting right atrial pressure of 3 mmHg.     Sedimentation rate and CRP were both low.  Compliance with diet, lifestyle and medications: Yes  I apologized to her about the discrepancy between her MR large pericardial effusion and her echocardiogram I independently reviewed small to moderate She has chronic pericarditis with thickened pericardium however she does not have compressive physiology and she has no late gadolinium enhancement.  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 Swallow whole.   b complex vitamins capsule Take 1 capsule by mouth daily.   buPROPion (WELLBUTRIN XL) 150 MG 24 hr tablet Take 450 mg by mouth daily.   Cholecalciferol (D3-1000) 25 MCG (1000 UT) tablet Take 1,000 Units by mouth daily.   meloxicam (MOBIC) 15 MG tablet Take 15 mg by mouth daily as needed for pain.   potassium chloride (KLOR-CON) 10 MEQ tablet Take 10 mEq by mouth daily.   rosuvastatin (CRESTOR) 20 MG tablet Take 20 mg by mouth daily.   torsemide (DEMADEX) 20 MG tablet Take 1 tablet (20 mg total) by mouth daily.     Allergies:   Patient has no known allergies.   Social History   Socioeconomic History   Marital status: Married    Spouse name: Not on file   Number of children: Not on file   Years of education: Not on file   Highest education level: Not on file  Occupational History   Not on file  Tobacco Use   Smoking status: Never    Passive exposure: Past   Smokeless tobacco: Never  Vaping Use   Vaping Use: Never used  Substance and Sexual Activity   Alcohol use: Yes   Drug use: Never   Sexual activity: Not on file  Other Topics Concern   Not on file  Social History Narrative   Not on file   Social Determinants of Health   Financial Resource Strain: Not on file  Food Insecurity: Not on file  Transportation Needs: Not on file  Physical Activity: Not on file   Stress: Not on file  Social Connections: Not on file     Family History: The patient's family history includes Alzheimer's disease in her mother; Congestive Heart Failure in her maternal grandfather; Healthy in her father; Heart Problems in her maternal grandmother; Liver cancer in her paternal grandfather; Other (age of onset: 65) in her paternal grandmother. ROS:   Please see the history of present illness.    All other systems reviewed and are negative.  EKGs/Labs/Other Studies Reviewed:    The following studies were reviewed today:    Recent Labs: 09/23/2021: TSH 3.060 10/07/2021: Hemoglobin 13.8; Platelets 203 10/08/2021: BUN 13; Creatinine, Ser 0.86; Potassium 3.6; Sodium 139  Recent Lipid Panel No results found for: CHOL, TRIG, HDL, CHOLHDL, VLDL, LDLCALC, LDLDIRECT  Physical Exam:    VS:  BP 102/68   Pulse 74   Ht '5\' 7"'$  (1.702 m)   Wt 220 lb 9.6 oz (100.1 kg)   SpO2 95%   BMI 34.55 kg/m  Wt Readings from Last 3 Encounters:  11/03/21 220 lb 9.6 oz (100.1 kg)  10/13/21 225 lb (102.1 kg)  10/07/21 230 lb (104.3 kg)     GEN:  Well nourished, well developed in no acute distress HEENT: Normal NECK: No JVD; No carotid bruits LYMPHATICS: No lymphadenopathy CARDIAC: RRR, no murmurs, rubs, gallops RESPIRATORY:  Clear to auscultation without rales, wheezing or rhonchi  ABDOMEN: Soft, non-tender, non-distended MUSCULOSKELETAL: 1+ lower extremity bilateral edema; No deformity  SKIN: Warm and dry NEUROLOGIC:  Alert and oriented x 3 PSYCHIATRIC:  Normal affect    Signed, Alyssa More, MD  11/03/2021 9:58 AM    [place]

## 2021-11-03 ENCOUNTER — Ambulatory Visit: Payer: Federal, State, Local not specified - PPO | Admitting: Cardiology

## 2021-11-03 ENCOUNTER — Encounter: Payer: Self-pay | Admitting: Cardiology

## 2021-11-03 VITALS — BP 102/68 | HR 74 | Ht 67.0 in | Wt 220.6 lb

## 2021-11-03 DIAGNOSIS — I3139 Other pericardial effusion (noninflammatory): Secondary | ICD-10-CM

## 2021-11-03 DIAGNOSIS — I10 Essential (primary) hypertension: Secondary | ICD-10-CM | POA: Diagnosis not present

## 2021-11-03 NOTE — Patient Instructions (Signed)
Medication Instructions:  Your physician recommends that you continue on your current medications as directed. Please refer to the Current Medication list given to you today.  *If you need a refill on your cardiac medications before your next appointment, please call your pharmacy*   Lab Work: None If you have labs (blood work) drawn today and your tests are completely normal, you will receive your results only by: [place] (if you have MyChart) OR A paper copy in the mail If you have any lab test that is abnormal or we need to change your treatment, we will call you to review the results.   Testing/Procedures: Non-Cardiac CT scanning, (CAT scanning), is a noninvasive, special x-ray that produces cross-sectional images of the body using x-rays and a computer. CT scans help physicians diagnose and treat medical conditions. For some CT exams, a contrast material is used to enhance visibility in the area of the body being studied. CT scans provide greater clarity and reveal more details than regular x-ray exams.    Follow-Up: At [facility], you and your health needs are our priority.  As part of our continuing mission to provide you with exceptional heart care, we have created designated Provider Care Teams.  These Care Teams include your primary Cardiologist (physician) and Advanced Practice Providers (APPs -  Physician Assistants and Nurse Practitioners) who all work together to provide you with the care you need, when you need it.  We recommend signing up for the patient portal called "MyChart".  Sign up information is provided on this After Visit Summary.  MyChart is used to connect with patients for Virtual Visits (Telemedicine).  Patients are able to view lab/test results, encounter notes, upcoming appointments, etc.  Non-urgent messages can be sent to your provider as well.   To learn more about what you can do with MyChart, go to [email or web address].    Your next  appointment:   1 year(s)  The format for your next appointment:   In Person  Provider:   Shirlee More, MD    Other Instructions None  Important Information About Sugar

## 2021-12-01 ENCOUNTER — Ambulatory Visit (HOSPITAL_COMMUNITY): Payer: Federal, State, Local not specified - PPO

## 2021-12-08 ENCOUNTER — Ambulatory Visit (HOSPITAL_COMMUNITY)
Admission: RE | Admit: 2021-12-08 | Discharge: 2021-12-08 | Disposition: A | Discharge status: Home / Self Care | Payer: Federal, State, Local not specified - PPO | Source: Ambulatory Visit | Attending: Cardiology | Admitting: Cardiology

## 2021-12-08 DIAGNOSIS — I10 Essential (primary) hypertension: Secondary | ICD-10-CM | POA: Diagnosis present

## 2021-12-08 DIAGNOSIS — I3139 Other pericardial effusion (noninflammatory): Secondary | ICD-10-CM | POA: Diagnosis present

## 2021-12-13 ENCOUNTER — Encounter: Payer: Self-pay | Admitting: Cardiology

## 2021-12-13 ENCOUNTER — Telehealth: Payer: Self-pay | Admitting: Cardiology

## 2021-12-13 NOTE — Telephone Encounter (Signed)
Attempted phone call to pt.  OK per Epic to leave detailed VM.  Pt advised of CT results as below.  Richardo Priest, MD  12/11/2021  2:51 PM EDT     I think this is a good result   No other abnormality on CT scan of her chest other than the chronic collection of fluid   Generally if called moderate on CT thoracic and cardiology by ultrasound it would be small to moderate.   This is stable.

## 2021-12-13 NOTE — Telephone Encounter (Signed)
    Pt is returning call to get CT result 

## 2021-12-17 ENCOUNTER — Ambulatory Visit: Payer: Federal, State, Local not specified - PPO | Admitting: Cardiology

## 2022-06-23 ENCOUNTER — Other Ambulatory Visit: Payer: Self-pay | Admitting: Cardiology

## 2022-06-23 NOTE — Telephone Encounter (Signed)
Torsemide 20 mg # 20 x 2 refills sent to  [facility], [place]

## 2023-01-16 ENCOUNTER — Encounter: Payer: Self-pay | Admitting: Cardiology

## 2023-01-31 ENCOUNTER — Ambulatory Visit: Payer: Federal, State, Local not specified - PPO | Admitting: Cardiology

## 2023-03-06 DIAGNOSIS — R011 Cardiac murmur, unspecified: Secondary | ICD-10-CM | POA: Insufficient documentation

## 2023-03-06 HISTORY — DX: Cardiac murmur, unspecified: R01.1

## 2023-03-06 NOTE — Progress Notes (Unsigned)
Cardiology Office Note:    Date:  03/06/2023   ID:  Alyssa Ortiz, DOB 02/18/1956, MRN [id number]  PCP:  Jim Like, NP  Cardiologist:  Norman Herrlich, MD    Referring MD: Jim Like, NP    ASSESSMENT:    No diagnosis found. PLAN:    In order of problems listed above:  ***   Next appointment: ***   Medication Adjustments/Labs and Tests Ordered: Current medicines are reviewed at length with the patient today.  Concerns regarding medicines are outlined above.  No orders of the defined types were placed in this encounter.  No orders of the defined types were placed in this encounter.    History of Present Illness:    Alyssa Ortiz is a 67 y.o. female with a hx of chronic pericardial effusion hypertension and hyperlipidemia last seen 11/03/2021.  She had a CT of the chest performed 12/08/2021 with stable moderate pericardial effusion.  Previous cardiac MRI April 2023 showed mild pericardial thickening 4 mm with what was described as large effusion with no finding of ventricular interdependence.  Subsequent echocardiogram showed moderate effusion maximum 16 mm with thickening of the pericardium. 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      EKGs/Labs/Other Studies Reviewed:    The following studies were reviewed today:  Cardiac Studies & Procedures       ECHOCARDIOGRAM  ECHOCARDIOGRAM LIMITED 10/13/2021  Narrative ECHOCARDIOGRAM LIMITED REPORT    Patient Name:   Alyssa Ortiz Date of Exam: 10/13/2021 Medical  Rec #:  [id number]             Height:       67.0 in Accession #:    [id number]            Weight:       225.0 lb Date of Birth:  January 30, 1956              BSA:          2.126 m Patient Age:    66 years              BP:           142/82 mmHg Patient Gender: F                     HR:           87 bpm. Exam Location:  Inpatient  Procedure: Limited Color Doppler, Cardiac Doppler and Limited Echo  Indications:    pericardial effusion  History:        Patient has prior history of Echocardiogram examinations, most recent 07/23/2021.  Sonographer:    Delcie Roch RDCS Referring Phys: [id number] Orbie Pyo  IMPRESSIONS   1. Moderate pericardial effusion measuring 1.6cm at end diastole at max diameter around the RV free wall. The pericardial effusion is circumferential. The IVC is normal in size and collapsible. There is no evidence of cardiac tamponade. 2. There is a prominent fat pad around the RV, however, the pericardium also appears thickened. No evidence of constriction (no ventricular  interdependence visualized, lateral e' is normal, and IVC is normal in size). 3. Left ventricular ejection fraction, by estimation, is 60 to 65%. The left ventricle has normal function. 4. The inferior vena cava is normal in size with greater than 50% respiratory variability, suggesting right atrial pressure of 3 mmHg.  Comparison(s): Compared to prior echo report in 07/2021, the effusion is now moderate in size. Previously reported as small-to-moderate.  FINDINGS Left Ventricle: Left ventricular ejection fraction, by estimation, is 60 to 65%. The left ventricle has normal function.  Pericardium: Suspect there is a prominent fat pad with possible pericardial thickening as well. A moderately sized pericardial effusion is present. The pericardial effusion is circumferential. There is no evidence of cardiac tamponade.  Venous: The inferior vena cava is normal in size with greater than 50% respiratory  variability, suggesting right atrial pressure of 3 mmHg.  Laurance Flatten MD Electronically signed by Laurance Flatten MD Signature Date/Time: 10/13/2021/7:06:54 PM    Final      CARDIAC MRI  MR CARDIAC MORPHOLOGY W WO CONTRAST 09/17/2021  Narrative CLINICAL DATA:  Clinical question of pericardial effusion Study assumes HCT of 43 and BSA of 2.21 m2.  EXAM: CARDIAC MRI  TECHNIQUE: The patient was scanned on a 1.5 Tesla GE magnet. A dedicated cardiac coil was used. Functional imaging was done using Fiesta sequences. 2,3, and 4 chamber views were done to assess for RWMA's. Modified Simpson's rule using a short axis stack was used to calculate an ejection fraction on a dedicated work [employer]. The patient received 10 cc of Gadavist. After 10 minutes inversion recovery sequences were used to assess for infiltration and scar tissue.  CONTRAST:  10 cc  of Gadavist  FINDINGS: 1. Normal left ventricular size, with LVEDD 52 mm, and LVEDVi 64 mL/m2.  Normal left ventricular thickness, with intraventricular septal thickness of 9 mm, posterior wall thickness of 7 mm, and myocardial mass index of 38 g/m2.  Normal left ventricular systolic function (LVEF =61%). There are no regional wall motion abnormalities.  Left ventricular parametric mapping notable for normal T2 and ECV signal.  There is no late gadolinium enhancement in the left ventricular myocardium.  2. Normal right ventricular size with RVEDVI 72 mL/m2.  Normal right ventricular thickness.  Normal right ventricular systolic function (RVEF =54%). There are no regional wall motion abnormalities or aneurysms.  3.  Normal left and right atrial size.  4. Normal size of the aortic root, ascending aorta and pulmonary artery.  5. Valve assessment:  Aortic Valve: Tri-leaflet aortic valve. Qualitatively, there is no significant regurgitation.  Pulmonic Valve: Qualitatively, there is no  significant regurgitation.  Tricuspid Valve: Qualitatively, there is no significant regurgitation.  Mitral Valve: Qualitatively, mild central regurgitation.  6. No pericardial enhancement. There is mild pericardial thickening pericardium- 4 mm on black blood imaging overlying the anterior RV pericardium.  There is a large pericardial effusion. Largest over the basal anterior LV 26 mm. Circumferential effusion.  There is no evidence of ventricular interdependence. There is no hepatic flow reversal. There is no evidence RV or RA collapse. There is no IVC dilation.  7. Grossly, no extracardiac findings. Recommended dedicated study if concerned for non-cardiac pathology.  8. Notable breathhold and wrap around artifacts noted. This decreased the sensitivity of volumetric assessment of valve disease.  IMPRESSION: Large, circumferential pericardial effusion with preserved LV and RV function.  Riley Lam MD   Electronically Signed By: Riley Lam M.D. On: 09/19/2021 17:12  Recent Labs: No results found for requested labs within last 365 days.  Recent Lipid Panel No results found for: "CHOL", "TRIG", "HDL", "CHOLHDL", "VLDL", "LDLCALC", "LDLDIRECT"  Physical Exam:    VS:  There were no vitals taken for this visit.    Wt Readings from Last 3 Encounters:  11/03/21 220 lb 9.6 oz (100.1 kg)  10/13/21 225 lb (102.1 kg)  10/07/21 230 lb (104.3 kg)     GEN: *** Well nourished, well developed in no acute distress HEENT: Normal NECK: No JVD; No carotid bruits LYMPHATICS: No lymphadenopathy CARDIAC: ***RRR, no murmurs, rubs, gallops RESPIRATORY:  Clear to auscultation without rales, wheezing or rhonchi  ABDOMEN: Soft, non-tender, non-distended MUSCULOSKELETAL:  No edema; No deformity  SKIN: Warm and dry NEUROLOGIC:  Alert and oriented x 3 PSYCHIATRIC:  Normal affect    Signed, Norman Herrlich, MD  03/06/2023 1:47 PM    [facility]

## 2023-03-07 ENCOUNTER — Encounter: Payer: Self-pay | Admitting: Cardiology

## 2023-03-07 ENCOUNTER — Ambulatory Visit: Payer: Federal, State, Local not specified - PPO | Attending: Cardiology | Admitting: Cardiology

## 2023-03-07 VITALS — BP 112/80 | HR 70 | Ht 67.0 in | Wt 216.4 lb

## 2023-03-07 DIAGNOSIS — I3139 Other pericardial effusion (noninflammatory): Secondary | ICD-10-CM

## 2023-03-07 DIAGNOSIS — E782 Mixed hyperlipidemia: Secondary | ICD-10-CM

## 2023-03-07 DIAGNOSIS — I10 Essential (primary) hypertension: Secondary | ICD-10-CM | POA: Diagnosis not present

## 2023-03-07 MED ORDER — COLCHICINE 0.6 MG PO TABS: 0.6000 mg | ORAL_TABLET | Freq: Every day | ORAL | 3 refills | Status: DC

## 2023-03-07 NOTE — Patient Instructions (Signed)
Medication Instructions:  Your physician has recommended you make the following change in your medication:   START: Colchicine 0.6 mg daily (Take this medication 3 times per week for 2 weeks and then daily)  *If you need a refill on your cardiac medications before your next appointment, please call your pharmacy*   Lab Work: Your physician recommends that you return for lab work in:   Labs in 2 weeks: BMP, CRP, Sed rate  If you have labs (blood work) drawn today and your tests are completely normal, you will receive your results only by: MyChart Message (if you have MyChart) OR A paper copy in the mail If you have any lab test that is abnormal or we need to change your treatment, we will call you to review the results.   Testing/Procedures: Your physician has requested that you have an echocardiogram. Echocardiography is a painless test that uses sound waves to create images of your heart. It provides your doctor with information about the size and shape of your heart and how well your heart's chambers and valves are working. This procedure takes approximately one hour. There are no restrictions for this procedure. Please do NOT wear cologne, perfume, aftershave, or lotions (deodorant is allowed). Please arrive 15 minutes prior to your appointment time.    Follow-Up: At [facility], you and your health needs are our priority.  As part of our continuing mission to provide you with exceptional heart care, we have created designated Provider Care Teams.  These Care Teams include your primary Cardiologist (physician) and Advanced Practice Providers (APPs -  Physician Assistants and Nurse Practitioners) who all work together to provide you with the care you need, when you need it.  We recommend signing up for the patient portal called "MyChart".  Sign up information is provided on this After Visit Summary.  MyChart is used to connect with patients for Virtual Visits (Telemedicine).   Patients are able to view lab/test results, encounter notes, upcoming appointments, etc.  Non-urgent messages can be sent to your provider as well.   To learn more about what you can do with MyChart, go to [email or web address].    Your next appointment:   3 month(s)  Provider:   Norman Herrlich, MD    Other Instructions None

## 2023-04-05 ENCOUNTER — Encounter: Payer: Self-pay | Admitting: Cardiology

## 2023-04-05 LAB — BASIC METABOLIC PANEL
BUN/Creatinine Ratio: 20 (ref 12–28)
BUN: 18 mg/dL (ref 8–27)
CO2: 30 mmol/L — ABNORMAL HIGH (ref 20–29)
Calcium: 10 mg/dL (ref 8.7–10.3)
Chloride: 101 mmol/L (ref 96–106)
Creatinine, Ser: 0.88 mg/dL (ref 0.57–1.00)
Glucose: 102 mg/dL — ABNORMAL HIGH (ref 70–99)
Potassium: 3.5 mmol/L (ref 3.5–5.2)
Sodium: 143 mmol/L (ref 134–144)
eGFR: 71 mL/min/{1.73_m2} (ref >59)

## 2023-04-05 LAB — C-REACTIVE PROTEIN: CRP: 1 mg/L (ref 0–10)

## 2023-04-05 LAB — SEDIMENTATION RATE: Sed Rate: 5 mm/h (ref 0–40)

## 2023-04-11 ENCOUNTER — Telehealth: Payer: Self-pay | Admitting: Cardiology

## 2023-04-11 NOTE — Telephone Encounter (Signed)
Pt was returning nurse call regarding results and is requesting a callback. Please advise

## 2023-04-12 NOTE — Telephone Encounter (Signed)
Left message for the patient to call back.

## 2023-04-13 ENCOUNTER — Telehealth: Payer: Self-pay

## 2023-04-13 NOTE — Telephone Encounter (Signed)
Patient is returning RN's call. Please advise. 

## 2023-04-13 NOTE — Telephone Encounter (Signed)
Patient called the office and stated that during her last visit with Dr. Dulce Sellar she was started on Colchicine three times weekly and was told she could increase the frequency to daily. She is asking if she can increase the medication to daily instead of three times weekly.

## 2023-04-14 NOTE — Telephone Encounter (Signed)
Left message for the patient to call back.

## 2023-04-17 ENCOUNTER — Other Ambulatory Visit: Payer: Self-pay

## 2023-04-17 NOTE — Telephone Encounter (Signed)
Called the patient and informed her that Dr. Dulce Sellar was agreeable with changing her Colchicine medication to daily. Patient verbalized understanding and had no further questions at this time.

## 2023-05-17 IMAGING — MR MR CARD MORPHOLOGY WO/W CM
46 of 48 series · 46 of 48 positions shown · IV contrast (Contrast agent)
Comparison: none

CLINICAL DATA: Clinical question of pericardial effusion
Study assumes HCT of 43 and BSA of 2.21 m2.

EXAM:
CARDIAC MRI
TECHNIQUE: The patient was scanned on a 1.5 Tesla GE magnet. A dedicated
cardiac coil was used. Functional imaging was done using Fiesta
sequences. [DATE], and 4 chamber views were done to assess for RWMA's.
Modified Grigri rule using a short axis stack was used to
calculate an ejection fraction on a dedicated work station using
[employer] software. The patient received 10 cc of Gadavist. After 10
minutes inversion recovery sequences were used to assess for
infiltration and scar tissue.
CONTRAST:  10 cc  of Gadavist

[Series 10: t2_haste_db_tra_bh · axial · 8.0mm · 1.48mm/px · 1 of 8 slices shown (1 of 2)]
[im 1/8]
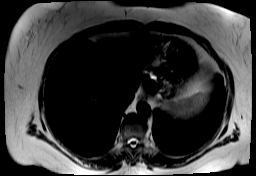

[Series 11: t2_haste_db_tra_bh · axial · 8.0mm · 1.48mm/px · 1 of 15 slices shown (2 of 2)]
[im 1/15]
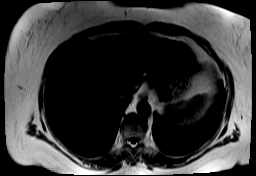

[Series 14: bSSFP · oblique · 8.0mm · 1.61mm/px · 1 of 18 slices shown (1 of 20)]
[im 1/18]
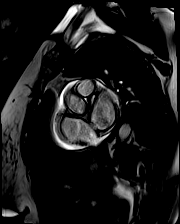

[Series 15: bSSFP · oblique · 8.0mm · 1.61mm/px · 1 of 18 slices shown (2 of 20)]
[im 1/18]
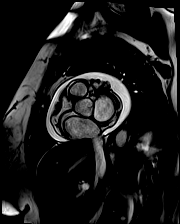

[Series 16: bSSFP · oblique · 8.0mm · 1.61mm/px · 1 of 18 slices shown (3 of 20)]
[im 1/18]
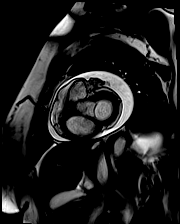

[Series 17: bSSFP · oblique · 8.0mm · 1.61mm/px · 1 of 18 slices shown (4 of 20)]
[im 1/18]
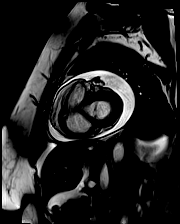

[Series 18: bSSFP · oblique · 8.0mm · 1.61mm/px · 1 of 18 slices shown (5 of 20)]
[im 1/18]
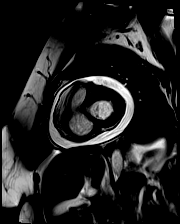

[Series 19: bSSFP · oblique · 8.0mm · 1.61mm/px · 1 of 18 slices shown (6 of 20)]
[im 1/18]
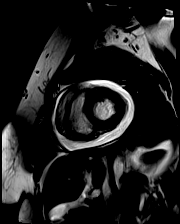

[Series 20: bSSFP · oblique · 8.0mm · 1.61mm/px · 1 of 18 slices shown (7 of 20)]
[im 1/18]
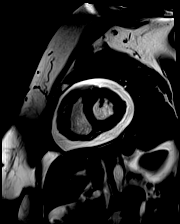

[Series 21: bSSFP · oblique · 8.0mm · 1.61mm/px · 1 of 18 slices shown (8 of 20)]
[im 1/18]
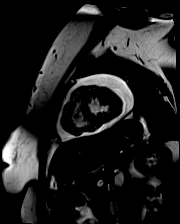

[Series 22: bSSFP · oblique · 8.0mm · 1.61mm/px · 1 of 18 slices shown (9 of 20)]
[im 1/18]
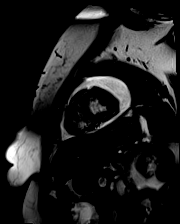

[Series 23: bSSFP · oblique · 8.0mm · 1.61mm/px · 1 of 18 slices shown (10 of 20)]
[im 1/18]
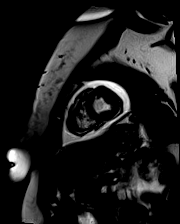

[Series 24: bSSFP · oblique · 8.0mm · 1.61mm/px · 1 of 18 slices shown (11 of 20)]
[im 1/18]
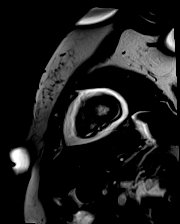

[Series 25: bSSFP · oblique · 8.0mm · 1.61mm/px · 1 of 18 slices shown (12 of 20)]
[im 1/18]
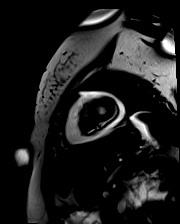

[Series 26: bSSFP · oblique · 8.0mm · 1.61mm/px · 1 of 18 slices shown (13 of 20)]
[im 1/18]
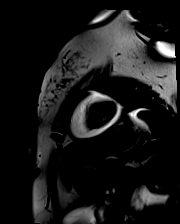

[Series 27: bSSFP · oblique · 8.0mm · 1.61mm/px · 1 of 18 slices shown (14 of 20)]
[im 1/18]
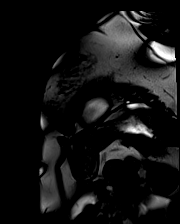

[Series 28: bSSFP · oblique · 8.0mm · 1.61mm/px · 1 of 18 slices shown (15 of 20)]
[im 1/18]
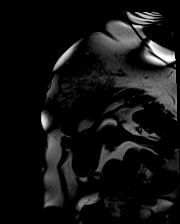

[Series 29: bSSFP · oblique · 8.0mm · 1.61mm/px · 1 of 18 slices shown (16 of 20)]
[im 1/18]
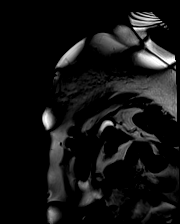

[Series 30: bSSFP · oblique · 6.0mm · 1.41mm/px · 1 of 16 slices shown (17 of 20)]
[im 1/16]
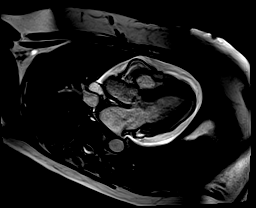

[Series 31: bSSFP · oblique · 6.0mm · 1.41mm/px · 1 of 25 slices shown (18 of 20)]
[im 1/25]
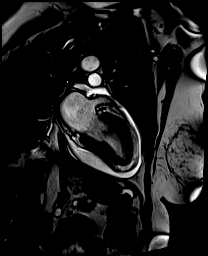

[Series 32: bSSFP · axial · 6.0mm · 1.41mm/px · 1 of 25 slices shown (19 of 20)]
[im 1/25]
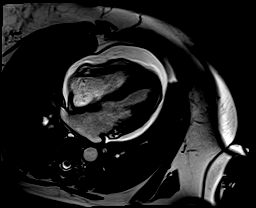

[Series 33: bSSFP · oblique · 6.0mm · 1.41mm/px · 1 of 25 slices shown (20 of 20)]
[im 1/25]
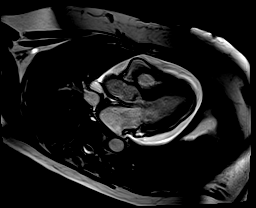

[Series 34: (id)_long_t1 · oblique · 8.0mm · 1.56mm/px · 1 of 24 slices shown]
[im 1/24]
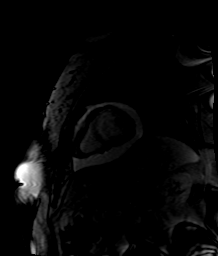

[Series 35: (id)_long_t1_moco · oblique · 8.0mm · 1.56mm/px · 1 of 24 slices shown]
[im 1/24]
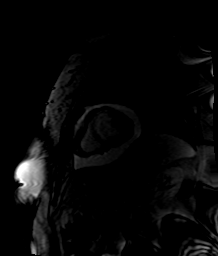

[Series 36: (id)_long_t1_moco_t1 · oblique · 8.0mm · 1.56mm/px · 1 of 3 slices shown]
[im 1/3]
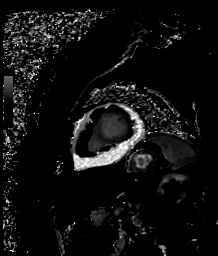

[Series 38: (id)_trufi · oblique · 8.0mm · 2.08mm/px · 1 of 9 slices shown]
[im 1/9]
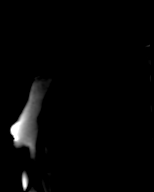

[Series 39: (id)_trufi_moco · oblique · 8.0mm · 2.08mm/px · 1 of 9 slices shown]
[im 1/9]
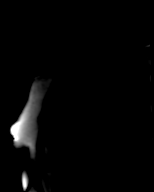

[Series 40: (id)_trufi_moco_t2 · oblique · 8.0mm · 2.08mm/px · 1 of 3 slices shown]
[im 1/3]
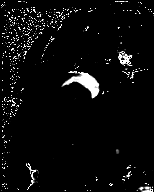

[Series 42: cine_trufi_short axis_cs_2_shot · oblique · 8.0mm · 1.48mm/px · 1 of 25 slices shown (1 of 15)]
[im 1/25]
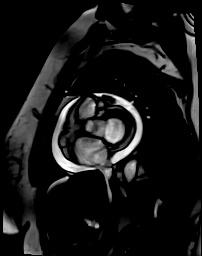

[Series 42: cine_trufi_short axis_cs_2_shot · oblique · 8.0mm · 1.48mm/px · 1 of 25 slices shown (2 of 15)]
[im 1/25]
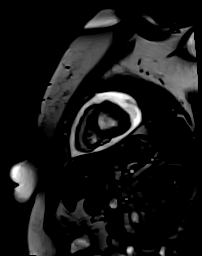

[Series 42: cine_trufi_short axis_cs_2_shot · oblique · 8.0mm · 1.48mm/px · 1 of 25 slices shown (3 of 15)]
[im 1/25]
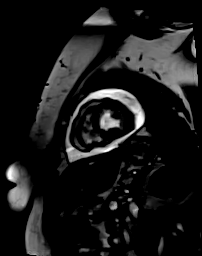

[Series 42: cine_trufi_short axis_cs_2_shot · oblique · 8.0mm · 1.48mm/px · 1 of 25 slices shown (4 of 15)]
[im 1/25]
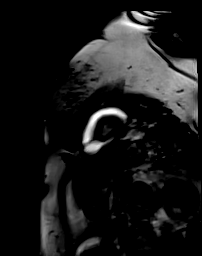

[Series 42: cine_trufi_short axis_cs_2_shot · oblique · 8.0mm · 1.48mm/px · 1 of 25 slices shown (5 of 15)]
[im 1/25]
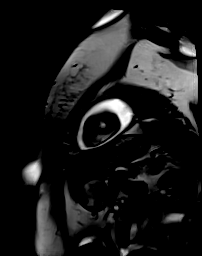

[Series 42: cine_trufi_short axis_cs_2_shot · oblique · 8.0mm · 1.48mm/px · 1 of 25 slices shown (6 of 15)]
[im 1/25]
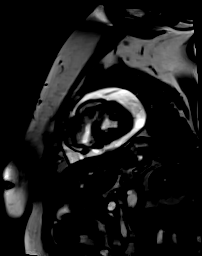

[Series 42: cine_trufi_short axis_cs_2_shot · oblique · 8.0mm · 1.48mm/px · 1 of 25 slices shown (7 of 15)]
[im 1/25]
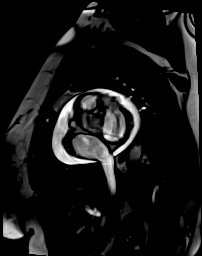

[Series 42: cine_trufi_short axis_cs_2_shot · oblique · 8.0mm · 1.48mm/px · 1 of 25 slices shown (8 of 15)]
[im 1/25]
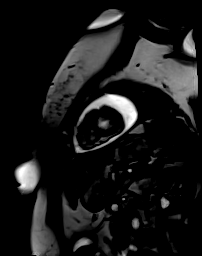

[Series 42: cine_trufi_short axis_cs_2_shot · oblique · 8.0mm · 1.48mm/px · 1 of 25 slices shown (9 of 15)]
[im 1/25]
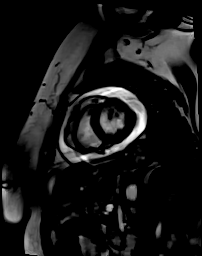

[Series 42: cine_trufi_short axis_cs_2_shot · oblique · 8.0mm · 1.48mm/px · 1 of 25 slices shown (10 of 15)]
[im 1/25]
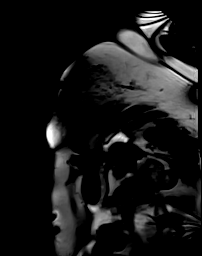

[Series 42: cine_trufi_short axis_cs_2_shot · oblique · 8.0mm · 1.48mm/px · 1 of 25 slices shown (11 of 15)]
[im 1/25]
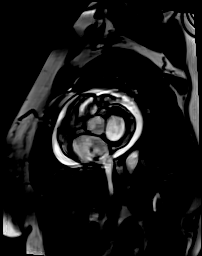

[Series 42: cine_trufi_short axis_cs_2_shot · oblique · 8.0mm · 1.48mm/px · 1 of 25 slices shown (12 of 15)]
[im 1/25]
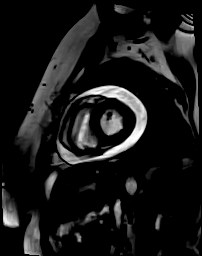

[Series 42: cine_trufi_short axis_cs_2_shot · oblique · 8.0mm · 1.48mm/px · 1 of 25 slices shown (13 of 15)]
[im 1/25]
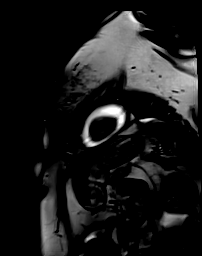

[Series 42: cine_trufi_short axis_cs_2_shot · oblique · 8.0mm · 1.48mm/px · 1 of 25 slices shown (14 of 15)]
[im 1/25]
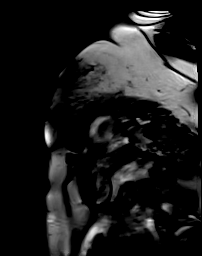

[Series 42: cine_trufi_short axis_cs_2_shot · oblique · 8.0mm · 1.48mm/px · 1 of 25 slices shown (15 of 15)]
[im 1/25]
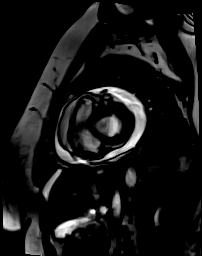

[Series 43: cine_trufi_3ch_cs_2_shot · oblique · 8.0mm · 1.48mm/px · 1 of 25 slices shown]
[im 1/25]
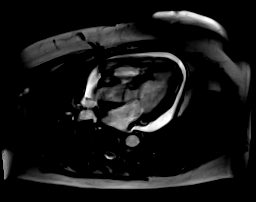

[Series 44: cine_trufi_4h_cs_2_shot · axial · 8.0mm · 1.48mm/px · 1 of 25 slices shown]
[im 1/25]
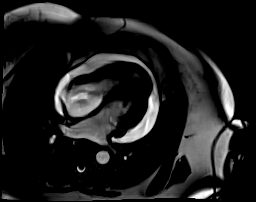

[Series 45: pre short axis · oblique · non-contrast · 8.0mm · 2.25mm/px · 1 of 10 slices shown]
[im 1/10]
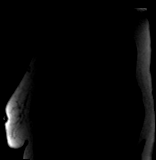

[46 of 48 positions shown; findings below may reference images not displayed]

FINDINGS: 1. Normal left ventricular size, with LVEDD 52 mm, and LVEDVi 64
mL/m2.

Normal left ventricular thickness, with intraventricular septal
thickness of 9 mm, posterior wall thickness of 7 mm, and myocardial
mass index of 38 g/m2.

Normal left ventricular systolic function (LVEF =61%). There are no
regional wall motion abnormalities.

Left ventricular parametric mapping notable for normal T2 and ECV
signal.

There is no late gadolinium enhancement in the left ventricular
myocardium.

2. Normal right ventricular size with RVEDVI 72 mL/m2.

Normal right ventricular thickness.

Normal right ventricular systolic function (RVEF =54%). There are no
regional wall motion abnormalities or aneurysms.

3.  Normal left and right atrial size.

4. Normal size of the aortic root, ascending aorta and pulmonary
artery.

5. Valve assessment:

Aortic Valve: Tri-leaflet aortic valve. Qualitatively, there is no
significant regurgitation.

Pulmonic Valve: Qualitatively, there is no significant
regurgitation.

Tricuspid Valve: Qualitatively, there is no significant
regurgitation.

Mitral Valve: Qualitatively, mild central regurgitation.

6. No pericardial enhancement. There is mild pericardial thickening
pericardium- 4 mm on black blood imaging overlying the anterior RV
pericardium.

There is a large pericardial effusion. Largest over the basal
anterior LV 26 mm. Circumferential effusion.

There is no evidence of ventricular interdependence. There is no
hepatic flow reversal. There is no evidence RV or RA collapse. There
is no IVC dilation.

7. Grossly, no extracardiac findings. Recommended dedicated study if
concerned for non-cardiac pathology.

8. Notable breathhold and wrap around artifacts noted. This
decreased the sensitivity of volumetric assessment of valve disease.
IMPRESSION: Large, circumferential pericardial effusion with preserved LV and RV
function.

## 2023-05-19 ENCOUNTER — Encounter (HOSPITAL_BASED_OUTPATIENT_CLINIC_OR_DEPARTMENT_OTHER): Payer: Self-pay

## 2023-05-19 ENCOUNTER — Ambulatory Visit (HOSPITAL_BASED_OUTPATIENT_CLINIC_OR_DEPARTMENT_OTHER)
Admission: RE | Admit: 2023-05-19 | Discharge: 2023-05-19 | Disposition: A | Discharge status: Home / Self Care | Payer: Federal, State, Local not specified - PPO | Source: Ambulatory Visit | Attending: Internal Medicine | Admitting: Internal Medicine

## 2023-05-19 ENCOUNTER — Other Ambulatory Visit: Payer: Self-pay | Admitting: Cardiology

## 2023-05-19 VITALS — BP 141/85 | HR 75 | Temp 97.9°F | Resp 18

## 2023-05-19 DIAGNOSIS — N390 Urinary tract infection, site not specified: Secondary | ICD-10-CM | POA: Insufficient documentation

## 2023-05-19 DIAGNOSIS — R5383 Other fatigue: Secondary | ICD-10-CM | POA: Diagnosis present

## 2023-05-19 DIAGNOSIS — Z87440 Personal history of urinary (tract) infections: Secondary | ICD-10-CM | POA: Diagnosis not present

## 2023-05-19 DIAGNOSIS — R300 Dysuria: Secondary | ICD-10-CM | POA: Diagnosis present

## 2023-05-19 DIAGNOSIS — R1030 Lower abdominal pain, unspecified: Secondary | ICD-10-CM | POA: Diagnosis present

## 2023-05-19 LAB — POCT URINALYSIS DIP (MANUAL ENTRY)
Bilirubin, UA: NEGATIVE
Glucose, UA: NEGATIVE mg/dL
Nitrite, UA: NEGATIVE
Protein Ur, POC: >=300 mg/dL — AB
Spec Grav, UA: >=1.03 — AB (ref 1.010–1.025)
Urobilinogen, UA: 0.2 U/dL
pH, UA: 6 (ref 5.0–8.0)

## 2023-05-19 MED ORDER — CEPHALEXIN 500 MG PO CAPS
500.0000 mg | ORAL_CAPSULE | Freq: Two times a day (BID) | ORAL | 0 refills | Status: AC
Start: 2023-05-19 — End: 2023-05-24

## 2023-05-19 NOTE — ED Provider Notes (Signed)
[facility]    CSN: [id number] Arrival date & time: 05/19/23  1136      History   Chief Complaint Chief Complaint  Patient presents with   Abdominal Pain    UtI - Entered by patient    HPI Alyssa Ortiz is a 67 y.o. female.   The history is provided by the patient.  Abdominal Pain Associated symptoms: dysuria, fatigue and hematuria   Associated symptoms: no chills, no cough, no fever and no sore throat   Suspected UTI symptom onset last p.m. symptoms include lower abdominal pressure, dysuria, frequency, urgency.  Admits bloody urine.  Does not feel well today. Has had UTIs in the past.  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      Home Medications    Prior to Admission medications   Medication Sig Start Date End Date Taking? Authorizing Provider  cephALEXin (KEFLEX) 500 MG capsule Take 1 capsule (500 mg total) by mouth 2 (two) times daily for 5 days. 05/19/23 05/24/23 Yes Meliton Rattan, PA  aspirin EC 81 MG tablet Take 81 mg by mouth daily. Swallow whole.     [provider]  buPROPion (WELLBUTRIN XL) 150 MG 24 hr tablet Take 450 mg by mouth daily.    [provider]  Cholecalciferol (D3-1000) 25 MCG (1000 UT) tablet Take 1,000 Units by mouth daily.    [provider]  colchicine 0.6 MG tablet Take 1 tablet (0.6 mg total) by mouth daily. Please take this medication 3 times per week for 2 weeks then switch to daily. 03/07/23   Baldo Daub, MD  meloxicam (MOBIC) 15 MG tablet Take 15 mg by mouth daily as needed for pain. 05/14/21   [provider]  potassium chloride (KLOR-CON) 10 MEQ tablet Take 10 mEq by mouth daily. 07/30/21   [provider]  rosuvastatin (CRESTOR) 20 MG tablet Take 20 mg by mouth daily. 07/30/21   [provider]  Semaglutide-Weight Management (WEGOVY) 0.5 MG/0.5ML SOAJ Inject 0.5 mg into the skin once a week.    [provider]  torsemide (DEMADEX) 20 MG tablet TAKE ONE TABLET BY MOUTH DAILY 06/23/22   Baldo Daub, MD    Family History Family History  Problem Relation Age of Onset   Alzheimer's disease Mother    Healthy Father    Heart Problems Maternal Grandmother        Tumor in heart   Congestive Heart Failure Maternal Grandfather        smoker   Other Paternal Grandmother 30  Fell broke neck   Liver cancer Paternal Grandfather     Social History Social History   Tobacco Use   Smoking status: Never    Passive exposure: Past   Smokeless tobacco: Never  Vaping Use   Vaping status: Never Used  Substance Use Topics   Alcohol use: Yes   Drug use: Never     Allergies   Patient has no known allergies.   Review of Systems Review of Systems  Constitutional:  Positive for fatigue. Negative for appetite change, chills and fever.  HENT:  Negative for congestion and sore throat.   Respiratory:  Negative for cough.   Gastrointestinal:  Positive for abdominal pain.  Genitourinary:  Positive for dysuria, frequency, hematuria and urgency.   Musculoskeletal:  Positive for back pain (chronic unchanged).     Physical Exam Triage Vital Signs ED Triage Vitals  Encounter Vitals Group     BP 05/19/23 1152 (!) 141/85     Systolic BP Percentile --      Diastolic BP Percentile --      Pulse Rate 05/19/23 1152 75     Resp 05/19/23 1152 18     Temp 05/19/23 1152 97.9 F (36.6 C)     Temp Source 05/19/23 1152 Oral     SpO2 05/19/23 1152 97 %     Weight --      Height --      Head Circumference --      Peak Flow --      Pain Score 05/19/23 1151 6     Pain Loc --      Pain Education --      Exclude from Growth Chart --    No data found.  Updated Vital Signs BP (!) 141/85 (BP Location: Right Arm)   Pulse 75   Temp 97.9 F (36.6 C) (Oral)   Resp 18   SpO2 97%   Visual Acuity Right Eye Distance:   Left Eye Distance:   Bilateral Distance:    Right Eye Near:   Left Eye Near:    Bilateral Near:     Physical Exam Vitals and nursing note reviewed.  Constitutional:      Appearance: She is not ill-appearing.  HENT:     Head: Normocephalic and atraumatic.  Cardiovascular:     Rate and Rhythm: Normal rate and regular rhythm.  Pulmonary:     Effort: Pulmonary effort is normal.     Breath sounds: Normal breath sounds. No rhonchi.  Abdominal:     Palpations: Abdomen is soft.     Tenderness: There is no abdominal tenderness. There is no right CVA tenderness or left CVA tenderness.  Skin:    General: Skin is warm.  Neurological:     Mental Status: She is alert and oriented to person, place, and time.      UC Treatments / Results  Labs (all labs ordered are listed, but only abnormal results are displayed) Labs Reviewed  POCT URINALYSIS DIP (MANUAL ENTRY) - Abnormal; Notable for the following components:      Result Value   Clarity, UA cloudy (*)    Ketones, POC UA trace (5) (*)    Spec Grav, UA >=1.030 (*)    Blood, UA large (*)    Protein Ur, POC >=300 (*)    Leukocytes, UA Trace (*)    All other  components within normal limits  URINE CULTURE    EKG   Radiology No results found.  Procedures Procedures (  including critical care time)  Medications Ordered in UC Medications - No data to display  Initial Impression / Assessment and Plan / UC Course  I have reviewed the triage vital signs and the nursing notes.  Pertinent labs & imaging results that were available during my care of the patient were reviewed by me and considered in my medical decision making (see chart for details).    67 year old female with symptoms of UTI since last p.m., well-appearing, afebrile, on tender abdomen, no CVA tenderness.  Point-of-care urinalysis is cloudy, concentrated with large blood trace leuks.  Will treat for UTI pending urine culture.  Patient counseled to follow-up with PCP, ED precautions reviewed, increase fluids Final Clinical Impressions(s) / UC Diagnoses   Final diagnoses:  Acute UTI   Discharge Instructions   None    ED Prescriptions     Medication Sig Dispense Auth. Provider   cephALEXin (KEFLEX) 500 MG capsule Take 1 capsule (500 mg total) by mouth 2 (two) times daily for 5 days. 10 capsule Meliton Rattan, PA      PDMP not reviewed this encounter.   Meliton Rattan, [place] 05/19/23 1219

## 2023-05-19 NOTE — ED Triage Notes (Signed)
Pt c/o lower abdominal/pelvic pain, discomfort with urination x 1 day. Pt has a history of UTI.

## 2023-05-22 LAB — URINE CULTURE: Culture: >=100000 — AB

## 2023-05-23 ENCOUNTER — Other Ambulatory Visit: Payer: Federal, State, Local not specified - PPO

## 2023-05-28 ENCOUNTER — Encounter (HOSPITAL_BASED_OUTPATIENT_CLINIC_OR_DEPARTMENT_OTHER): Payer: Self-pay

## 2023-05-28 ENCOUNTER — Ambulatory Visit (HOSPITAL_BASED_OUTPATIENT_CLINIC_OR_DEPARTMENT_OTHER)
Admission: EM | Admit: 2023-05-28 | Discharge: 2023-05-28 | Disposition: A | Discharge status: Home / Self Care | Payer: Federal, State, Local not specified - PPO | Attending: Internal Medicine | Admitting: Internal Medicine

## 2023-05-28 DIAGNOSIS — R300 Dysuria: Secondary | ICD-10-CM | POA: Diagnosis present

## 2023-05-28 DIAGNOSIS — N3000 Acute cystitis without hematuria: Secondary | ICD-10-CM | POA: Diagnosis not present

## 2023-05-28 LAB — POCT URINALYSIS DIP (MANUAL ENTRY)
Bilirubin, UA: NEGATIVE
Glucose, UA: NEGATIVE mg/dL
Ketones, POC UA: NEGATIVE mg/dL
Nitrite, UA: NEGATIVE
Protein Ur, POC: >=300 mg/dL — AB
Spec Grav, UA: 1.02 (ref 1.010–1.025)
Urobilinogen, UA: 0.2 U/dL
pH, UA: 7.5 (ref 5.0–8.0)

## 2023-05-28 MED ORDER — SULFAMETHOXAZOLE-TRIMETHOPRIM 800-160 MG PO TABS: 1.0000 | ORAL_TABLET | Freq: Two times a day (BID) | ORAL | 0 refills | Status: AC

## 2023-05-28 NOTE — ED Provider Notes (Signed)
[facility]    CSN: [id number] Arrival date & time: 05/28/23  0834      History   Chief Complaint Chief Complaint  Patient presents with   Dysuria    HPI Alyssa Ortiz is a 68 y.o. female.   Patient presents to urgent care for evaluation of persistent urinary symptoms.  She was recently seen at urgent care on May 19, 2023 (approximately 10 days ago) where she was treated for UTI with Keflex twice daily for 5 days.  She took Keflex as prescribed and states dysuria, urinary frequency, gross hematuria, and lower abdominal pain fully resolved for a few days.  Symptoms returned this morning with dysuria, urinary urgency, and urinary frequency.  She has not seen any gross hematuria today.  Urine culture grew E. coli greater than 100,000 colonies.  Denies history of frequent urinary tract infections, flank pain, dizziness, headache, nausea, vomiting, diarrhea, fever, chills, rash, and bodyaches.  She drinks a 20 ounce diet Coke daily as well as coffee and lemonade with approximately 32 ounces of water intake daily.  States she has been trying to improve her water intake to prevent dehydration.  She has not attempted use of any over-the-counter medications to help with symptoms PTA and denies use of SGLT2 inhibitor/history of diabetes.   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      Home Medications    Prior to Admission medications   Medication Sig Start Date End Date Taking? Authorizing Provider  sulfamethoxazole-trimethoprim (BACTRIM DS) 800-160 MG tablet Take 1 tablet by mouth 2 (two) times daily for 7 days. 05/28/23 06/04/23 Yes Carlisle Beers, FNP  aspirin EC 81 MG tablet Take 81 mg by mouth daily. Swallow whole.    [provider]  buPROPion (WELLBUTRIN XL) 150 MG 24 hr tablet Take 450 mg by mouth daily.    [provider]  Cholecalciferol (D3-1000) 25 MCG (1000 UT) tablet Take 1,000 Units by mouth daily.    [provider]  colchicine 0.6 MG tablet Take 1 tablet (0.6 mg total) by mouth daily. Please take this medication 3 times per week for 2 weeks then switch to daily. 03/07/23   Baldo Daub, MD  meloxicam (MOBIC) 15 MG tablet Take 15 mg by mouth daily as needed for pain. 05/14/21   [provider]  potassium chloride (KLOR-CON) 10 MEQ tablet Take 10 mEq by mouth daily. 07/30/21   [provider]  rosuvastatin (CRESTOR) 20 MG tablet Take 20 mg by mouth daily. 07/30/21   [provider]  Semaglutide-Weight Management (WEGOVY) 0.5 MG/0.5ML  SOAJ Inject 0.5 mg into the skin once a week.    [provider]  torsemide (DEMADEX) 20 MG tablet TAKE ONE TABLET BY MOUTH DAILY 05/19/23   Baldo Daub, MD    Family History Family History  Problem Relation Age of Onset   Alzheimer's disease Mother    Healthy Father    Heart Problems Maternal Grandmother        Tumor in heart   Congestive Heart Failure Maternal Grandfather        smoker   Other Paternal Grandmother 7       Fell broke neck   Liver cancer Paternal  Grandfather     Social History Social History   Tobacco Use   Smoking status: Never    Passive exposure: Past   Smokeless tobacco: Never  Vaping Use   Vaping status: Never Used  Substance Use Topics   Alcohol use: Yes   Drug use: Never     Allergies   Patient has no known allergies.   Review of Systems Review of Systems  Genitourinary:  Positive for dysuria.  Per HPI   Physical Exam Triage Vital Signs ED Triage Vitals  Encounter Vitals Group     BP 05/28/23 1004 116/78     Systolic BP Percentile --      Diastolic BP Percentile --      Pulse Rate 05/28/23 1004 63     Resp 05/28/23 1004 20     Temp 05/28/23 1004 97.8 F (36.6 C)     Temp Source 05/28/23 1004 Oral     SpO2 05/28/23 1004 97 %     Weight 05/28/23 0942 210 lb (95.3 kg)     Height --      Head Circumference --      Peak Flow --      Pain Score 05/28/23 0941 6     Pain Loc --      Pain Education --      Exclude from Growth Chart --    No data found.  Updated Vital Signs BP 116/78 (BP Location: Right Arm)   Pulse 63   Temp 97.8 F (36.6 C) (Oral)   Resp 20   Wt 210 lb (95.3 kg)   SpO2 97%   BMI 32.89 kg/m   Visual Acuity Right Eye Distance:   Left Eye Distance:   Bilateral Distance:    Right Eye Near:   Left Eye Near:    Bilateral Near:     Physical Exam Vitals and nursing note reviewed.  Constitutional:      Appearance: She is not ill-appearing or toxic-appearing.  HENT:     Head: Normocephalic and atraumatic.     Right Ear: Hearing and external ear normal.     Left Ear: Hearing and external ear normal.     Nose: Nose normal.     Mouth/Throat:     Lips: Pink.  Eyes:     General: Lids are normal. Vision grossly intact. Gaze aligned appropriately.     Extraocular Movements: Extraocular movements intact.     Conjunctiva/sclera: Conjunctivae normal.  Pulmonary:     Effort: Pulmonary effort is normal.  Abdominal:     General: Bowel sounds are normal.     Palpations:  Abdomen is soft.     Tenderness: There is no abdominal tenderness. There is no right CVA tenderness, left CVA tenderness or guarding.  Musculoskeletal:     Cervical back: Neck supple.  Skin:  General: Skin is warm and dry.     Capillary Refill: Capillary refill takes less than 2 seconds.     Findings: No rash.  Neurological:     General: No focal deficit present.     Mental Status: She is alert and oriented to person, place, and time. Mental status is at baseline.     Cranial Nerves: No dysarthria or facial asymmetry.  Psychiatric:        Mood and Affect: Mood normal.        Speech: Speech normal.        Behavior: Behavior normal.        Thought Content: Thought content normal.        Judgment: Judgment normal.      UC Treatments / Results  Labs (all labs ordered are listed, but only abnormal results are displayed) Labs Reviewed  POCT URINALYSIS DIP (MANUAL ENTRY) - Abnormal; Notable for the following components:      Result Value   Color, UA straw (*)    Clarity, UA cloudy (*)    Blood, UA large (*)    Protein Ur, POC >=300 (*)    Leukocytes, UA Small (1+) (*)    All other components within normal limits  URINE CULTURE    EKG   Radiology No results found.  Procedures Procedures (including critical care time)  Medications Ordered in UC Medications - No data to display  Initial Impression / Assessment and Plan / UC Course  I have reviewed the triage vital signs and the nursing notes.  Pertinent labs & imaging results that were available during my care of the patient were reviewed by me and considered in my medical decision making (see chart for details).   1.  Acute recurrent cystitis Urinalysis shows signs of persistent urinary tract infection. Reviewed recent blood work from October 2024 showing normal renal function. Low suspicion for AKI, pyelonephritis, sepsis, and nephrolithiasis. Will treat with Bactrim twice daily for 7 days.  Advised to reduce  intake of urinary irritants and promote fluid intake to stay well-hydrated.  Counseled patient on potential for adverse effects with medications prescribed/recommended today, strict ER and return-to-clinic precautions discussed, patient verbalized understanding.    Final Clinical Impressions(s) / UC Diagnoses   Final diagnoses:  Acute recurrent cystitis     Discharge Instructions      Your urine shows you likely have a urinary tract infection.  I have sent your urine for culture to confirm this.  We will call you if we need to change your antibiotic when we find out the type of bacteria growing in your bladder.  Take antibiotic as directed with a snack/food to avoid stomach upset. To avoid GI upset please take this medication with food.   Avoid drinking beverages that irritate the urinary tract like sodas, tea, coffee, or juice. Drink plenty of water to stay well hydrated and prevent severe infection.  If you develop any new or worsening symptoms or if your symptoms do not start to improve, pleases return here or follow-up with your primary care provider. If your symptoms are severe, please go to the emergency room.     ED Prescriptions     Medication Sig Dispense Auth. Provider   sulfamethoxazole-trimethoprim (BACTRIM DS) 800-160 MG tablet Take 1 tablet by mouth 2 (two) times daily for 7 days. 14 tablet Carlisle Beers, FNP      PDMP not reviewed this encounter.   Carlisle Beers, [place] 05/28/23 1057

## 2023-05-28 NOTE — Discharge Instructions (Signed)

## 2023-05-28 NOTE — ED Triage Notes (Signed)
Onset of dysuria at 0630 today. Seen on 12/13 for same and completed treatment.

## 2023-05-30 LAB — URINE CULTURE: Culture: 20000 — AB

## 2023-06-02 ENCOUNTER — Ambulatory Visit: Payer: Federal, State, Local not specified - PPO | Attending: Cardiology

## 2023-06-02 DIAGNOSIS — I10 Essential (primary) hypertension: Secondary | ICD-10-CM | POA: Diagnosis not present

## 2023-06-02 DIAGNOSIS — I3139 Other pericardial effusion (noninflammatory): Secondary | ICD-10-CM | POA: Diagnosis not present

## 2023-06-02 DIAGNOSIS — E782 Mixed hyperlipidemia: Secondary | ICD-10-CM | POA: Diagnosis not present

## 2023-06-04 ENCOUNTER — Encounter: Payer: Self-pay | Admitting: Cardiology

## 2023-06-04 LAB — ECHOCARDIOGRAM COMPLETE
Area-P 1/2: 2.91 cm2
S' Lateral: 3.3 cm

## 2023-06-09 ENCOUNTER — Ambulatory Visit: Payer: Federal, State, Local not specified - PPO | Admitting: Cardiology

## 2023-06-28 ENCOUNTER — Encounter (HOSPITAL_BASED_OUTPATIENT_CLINIC_OR_DEPARTMENT_OTHER): Payer: Self-pay

## 2023-06-28 ENCOUNTER — Ambulatory Visit (HOSPITAL_BASED_OUTPATIENT_CLINIC_OR_DEPARTMENT_OTHER)
Admission: RE | Admit: 2023-06-28 | Discharge: 2023-06-28 | Disposition: A | Discharge status: Home / Self Care | Payer: Federal, State, Local not specified - PPO | Source: Ambulatory Visit

## 2023-06-28 VITALS — BP 131/85 | HR 68 | Temp 97.6°F | Resp 18

## 2023-06-28 DIAGNOSIS — J069 Acute upper respiratory infection, unspecified: Secondary | ICD-10-CM

## 2023-06-28 DIAGNOSIS — R051 Acute cough: Secondary | ICD-10-CM

## 2023-06-28 MED ORDER — BENZONATATE 200 MG PO CAPS: 200.0000 mg | ORAL_CAPSULE | Freq: Three times a day (TID) | ORAL | 0 refills | Status: DC | PRN

## 2023-06-28 NOTE — ED Triage Notes (Signed)
Pt reports she has had a cold x 2 weeks. Pt coughing, nasal congestion, sinus pain.

## 2023-06-28 NOTE — ED Provider Notes (Signed)
[facility]    CSN: [id number] Arrival date & time: 06/28/23  1155      History   Chief Complaint Chief Complaint  Patient presents with   Cough    2+ weeks cough/cold sxs - Entered by patient    HPI Alyssa Ortiz is a 68 y.o. female.   Here with complaint of runny nose, cough, congestion since 06/12/2023.  Symptoms have persisted.  She has not had fever.  She has a chronic cough but denies wheezing.  She has had a little bit of a sore throat and headache at times and may be some sinus pressure also.  Denies fever, nausea, vomiting, diarrhea, constipation.   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      Home Medications    Prior to Admission medications   Medication Sig Start Date End Date Taking? Authorizing Provider  benzonatate (TESSALON) 200 MG capsule Take 1 capsule (200 mg total) by mouth 3 (three) times daily as needed for cough. 06/28/23  Yes  Prescilla Sours, FNP  buPROPion (WELLBUTRIN XL) 150 MG 24 hr tablet Take 450 mg by mouth daily.   Yes [provider]  colchicine 0.6 MG tablet Take 1 tablet (0.6 mg total) by mouth daily. Please take this medication 3 times per week for 2 weeks then switch to daily. 03/07/23  Yes Baldo Daub, MD  metFORMIN (GLUCOPHAGE-XR) 500 MG 24 hr tablet Take 500 mg by mouth daily. 06/15/23  Yes [provider]  rosuvastatin (CRESTOR) 20 MG tablet Take 20 mg by mouth daily. 07/30/21  Yes [provider]  topiramate (TOPAMAX) 25 MG tablet Take 50 mg by mouth daily. 06/15/23  Yes [provider]  torsemide (DEMADEX) 20 MG tablet TAKE ONE TABLET BY MOUTH DAILY 05/19/23  Yes Baldo Daub, MD  aspirin EC 81 MG tablet Take 81 mg by mouth daily. Swallow whole.    [provider]  Cholecalciferol (D3-1000) 25 MCG (1000 UT) tablet Take 1,000 Units by mouth daily.    [provider]  meloxicam (MOBIC) 15 MG tablet Take 15 mg by mouth daily as needed for pain. 05/14/21   [provider]  potassium chloride (KLOR-CON) 10 MEQ tablet Take 10 mEq by mouth daily. 07/30/21   [provider]  Semaglutide-Weight Management (WEGOVY) 0.5 MG/0.5ML SOAJ Inject 0.5 mg into the skin once a week.    [provider]  Family History Family History  Problem Relation Age of Onset   Alzheimer's disease Mother    Healthy Father    Heart Problems Maternal Grandmother        Tumor in heart   Congestive Heart Failure Maternal Grandfather        smoker   Other Paternal Grandmother 50       Fell broke neck   Liver cancer Paternal Grandfather     Social History Social History   Tobacco Use   Smoking status: Never    Passive exposure: Past   Smokeless tobacco: Never  Vaping Use   Vaping status: Never Used  Substance Use Topics   Alcohol use: Yes   Drug use: Never     Allergies   Patient has no known allergies.   Review of Systems Review of  Systems  Constitutional:  Negative for chills and fever.  HENT:  Positive for congestion, postnasal drip, rhinorrhea, sinus pressure, sinus pain, sneezing and sore throat. Negative for ear pain.   Eyes:  Negative for pain and visual disturbance.  Respiratory:  Positive for cough. Negative for shortness of breath.   Cardiovascular:  Negative for chest pain and palpitations.  Gastrointestinal:  Negative for abdominal pain and vomiting.  Genitourinary:  Negative for dysuria and hematuria.  Musculoskeletal:  Negative for arthralgias and back pain.  Skin:  Negative for color change and rash.  Neurological:  Negative for seizures and syncope.  All other systems reviewed and are negative.    Physical Exam Triage Vital Signs ED Triage Vitals  Encounter Vitals Group     BP 06/28/23 1222 131/85     Systolic BP Percentile --      Diastolic BP Percentile --      Pulse Rate 06/28/23 1222 68     Resp 06/28/23 1222 18     Temp 06/28/23 1222 97.6 F (36.4 C)     Temp Source 06/28/23 1222 Oral     SpO2 06/28/23 1222 96 %     Weight --      Height --      Head Circumference --      Peak Flow --      Pain Score 06/28/23 1219 0     Pain Loc --      Pain Education --      Exclude from Growth Chart --    No data found.  Updated Vital Signs BP 131/85 (BP Location: Right Arm)   Pulse 68   Temp 97.6 F (36.4 C) (Oral)   Resp 18   SpO2 96%   Visual Acuity Right Eye Distance:   Left Eye Distance:   Bilateral Distance:    Right Eye Near:   Left Eye Near:    Bilateral Near:     Physical Exam Vitals and nursing note reviewed.  Constitutional:      General: She is not in acute distress.    Appearance: She is well-developed. She is not ill-appearing or toxic-appearing.  HENT:     Head: Normocephalic and atraumatic.     Right Ear: Hearing, tympanic membrane, ear canal and external ear normal.     Left Ear: Hearing, tympanic membrane, ear canal and external ear normal.     Nose: Nose  normal.     Mouth/Throat:     Lips: Pink.     Mouth: Mucous membranes are moist.     Pharynx: Uvula midline. No oropharyngeal exudate or posterior oropharyngeal erythema.     Tonsils:  No tonsillar exudate.  Eyes:     Conjunctiva/sclera: Conjunctivae normal.     Pupils: Pupils are equal, round, and reactive to light.  Cardiovascular:     Rate and Rhythm: Normal rate and regular rhythm.     Heart sounds: S1 normal and S2 normal. No murmur heard. Pulmonary:     Effort: Pulmonary effort is normal. No respiratory distress.     Breath sounds: Normal breath sounds. No decreased breath sounds, wheezing, rhonchi or rales.  Abdominal:     Palpations: Abdomen is soft.     Tenderness: There is no abdominal tenderness.  Musculoskeletal:        General: No swelling.     Cervical back: Neck supple.  Lymphadenopathy:     Head:     Right side of head: No submental, submandibular, tonsillar, preauricular or posterior auricular adenopathy.     Left side of head: No submental, submandibular, tonsillar, preauricular or posterior auricular adenopathy.     Cervical: No cervical adenopathy.     Right cervical: No superficial cervical adenopathy.    Left cervical: No superficial cervical adenopathy.  Skin:    General: Skin is warm and dry.     Capillary Refill: Capillary refill takes less than 2 seconds.     Findings: No rash.  Neurological:     Mental Status: She is alert and oriented to person, place, and time.  Psychiatric:        Mood and Affect: Mood normal.      UC Treatments / Results  Labs (all labs ordered are listed, but only abnormal results are displayed) Labs Reviewed - No data to display  EKG   Radiology No results found.  Procedures Procedures (including critical care time)  Medications Ordered in UC Medications - No data to display  Initial Impression / Assessment and Plan / UC Course  I have reviewed the triage vital signs and the nursing notes.  Pertinent labs &  imaging results that were available during my care of the patient were reviewed by me and considered in my medical decision making (see chart for details).  No benefit to flu or COVID testing given the duration of her symptoms.  Her exam and history are most consistent with a viral upper respiratory infection.  Offered prednisone to help clear up her cough and congestion.  She declined, she has had a previous bad reaction to prednisone.  Offered albuterol to help open up her chest so she could breathe better, but she declined because her daughter had a reaction to albuterol when she was young.  Prefers benzonatate to a cough syrup.  Benzonatate, 200 mg, every 8 hours, as needed for cough.  Follow-up if symptoms do not improve, worsen or new symptoms occur Final Clinical Impressions(s) / UC Diagnoses   Final diagnoses:  Viral upper respiratory infection  Acute cough     Discharge Instructions      Exam is most consistent with a viral upper respiratory infection.  Very little value to flu or COVID testing.  She has had symptoms for 2-1/2 weeks and has not had fever.  Discussed options for treatment.  She had a significant reaction with hallucinations, took prednisone, after brain surgery years ago.  She is not interested in any steroids.  Offered albuterol but her daughter had a hyperactive reaction to albuterol so she does not want to take that.  She has tried [employer] or benzonatate in the past and would like to try that.  Benzonatate,  200 mg, every 8 hours, as needed for cough.  Get plenty of fluids and rest.  Follow-up if symptoms do not improve, worsen or new symptoms occur.  Discussed that next steps would be blood work and/or chest x-ray.     ED Prescriptions     Medication Sig Dispense Auth. Provider   benzonatate (TESSALON) 200 MG capsule Take 1 capsule (200 mg total) by mouth 3 (three) times daily as needed for cough. 20 capsule Prescilla Sours, FNP      PDMP not reviewed  this encounter.   Prescilla Sours, FNP 06/28/23 1247

## 2023-06-28 NOTE — Discharge Instructions (Signed)
Exam is most consistent with a viral upper respiratory infection.  Very little value to flu or COVID testing.  She has had symptoms for 2-1/2 weeks and has not had fever.  Discussed options for treatment.  She had a significant reaction with hallucinations, took prednisone, after brain surgery years ago.  She is not interested in any steroids.  Offered albuterol but her daughter had a hyperactive reaction to albuterol so she does not want to take that.  She has tried [employer] or benzonatate in the past and would like to try that.  Benzonatate, 200 mg, every 8 hours, as needed for cough.  Get plenty of fluids and rest.  Follow-up if symptoms do not improve, worsen or new symptoms occur.  Discussed that next steps would be blood work and/or chest x-ray.

## 2023-07-02 ENCOUNTER — Ambulatory Visit (HOSPITAL_BASED_OUTPATIENT_CLINIC_OR_DEPARTMENT_OTHER)
Admission: EM | Admit: 2023-07-02 | Discharge: 2023-07-02 | Disposition: A | Discharge status: Home / Self Care | Payer: Federal, State, Local not specified - PPO | Attending: Family Medicine | Admitting: Family Medicine

## 2023-07-02 ENCOUNTER — Encounter (HOSPITAL_BASED_OUTPATIENT_CLINIC_OR_DEPARTMENT_OTHER): Payer: Self-pay | Admitting: Emergency Medicine

## 2023-07-02 DIAGNOSIS — J069 Acute upper respiratory infection, unspecified: Secondary | ICD-10-CM | POA: Diagnosis not present

## 2023-07-02 MED ORDER — PREDNISONE 20 MG PO TABS: 20.0000 mg | ORAL_TABLET | Freq: Every day | ORAL | 0 refills | Status: AC

## 2023-07-02 MED ORDER — PROMETHAZINE-DM 6.25-15 MG/5ML PO SYRP
5.0000 mL | ORAL_SOLUTION | Freq: Four times a day (QID) | ORAL | 0 refills | Status: DC | PRN
Start: 2023-07-02 — End: 2023-11-10

## 2023-07-02 NOTE — ED Triage Notes (Signed)
Pt reports at last visit she was offered steroid and she didn't want to take them but she still not feeling better, now she would like to try the steroids. Pt reports her left eye is itchy and drainage noted this morning.

## 2023-07-02 NOTE — ED Provider Notes (Signed)
[facility]    CSN: [id number] Arrival date & time: 07/02/23  1123      History   Chief Complaint No chief complaint on file.   HPI Alyssa Ortiz is a 68 y.o. female.   Continues with runny nose, cough, head congestion, some sinus pressure or pain.  No fever, no wheezing, no nausea, no vomiting, no diarrhea, no constipation.  She was seen a few days ago and declined steroids.  She had had a significant reaction to steroids at that she was given for a brain surgery.  She is not feeling better and she would like to try the steroids.  She does feel like this may be settling in her chest a little bit and she is concerned about possible bronchitis.  Benzonatate did not help her symptoms at all.     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      Home Medications    Prior to Admission medications   Medication Sig Start Date End Date Taking? Authorizing Provider  predniSONE (DELTASONE) 20 MG tablet Take 1 tablet (20 mg total) by mouth daily with breakfast for 5 days. 07/02/23 07/07/23 Yes Prescilla Sours, FNP   promethazine-dextromethorphan (PROMETHAZINE-DM) 6.25-15 MG/5ML syrup Take 5 mLs by mouth 4 (four) times daily as needed for cough. 07/02/23  Yes Prescilla Sours, FNP  aspirin EC 81 MG tablet Take 81 mg by mouth daily. Swallow whole.    [provider]  benzonatate (TESSALON) 200 MG capsule Take 1 capsule (200 mg total) by mouth 3 (three) times daily as needed for cough. 06/28/23   Prescilla Sours, FNP  buPROPion (WELLBUTRIN XL) 150 MG 24 hr tablet Take 450 mg by mouth daily.    [provider]  Cholecalciferol (D3-1000) 25 MCG (1000 UT) tablet Take 1,000 Units by mouth daily.    [provider]  colchicine 0.6 MG tablet Take 1 tablet (0.6 mg total) by mouth daily. Please take this medication 3 times per week for 2 weeks then switch to daily. 03/07/23   Baldo Daub, MD  meloxicam (MOBIC) 15 MG tablet Take 15 mg by mouth daily as needed for pain. 05/14/21   [provider]  metFORMIN (GLUCOPHAGE-XR) 500 MG 24 hr tablet Take 500 mg by mouth daily. 06/15/23   [provider]  potassium chloride (KLOR-CON) 10 MEQ tablet Take 10 mEq by mouth daily. 07/30/21   [provider]  rosuvastatin (CRESTOR) 20 MG tablet Take 20 mg by mouth daily. 07/30/21   [provider]  Semaglutide-Weight Management (WEGOVY) 0.5 MG/0.5ML SOAJ Inject 0.5 mg into the skin once  a week.    [provider]  topiramate (TOPAMAX) 25 MG tablet Take 50 mg by mouth daily. 06/15/23   [provider]  torsemide (DEMADEX) 20 MG tablet TAKE ONE TABLET BY MOUTH DAILY 05/19/23   Baldo Daub, MD    Family History Family History  Problem Relation Age of Onset   Alzheimer's disease Mother    Healthy Father    Heart Problems Maternal Grandmother        Tumor in heart   Congestive Heart Failure Maternal Grandfather        smoker   Other Paternal Grandmother 70       Fell broke neck   Liver cancer Paternal Grandfather     Social History Social History    Tobacco Use   Smoking status: Never    Passive exposure: Past   Smokeless tobacco: Never  Vaping Use   Vaping status: Never Used  Substance Use Topics   Alcohol use: Yes   Drug use: Never     Allergies   Patient has no known allergies.   Review of Systems Review of Systems  Constitutional:  Negative for chills and fever.  HENT:  Positive for congestion, postnasal drip, rhinorrhea, sinus pressure and sinus pain. Negative for ear pain and sore throat.   Eyes:  Negative for pain and visual disturbance.  Respiratory:  Positive for cough. Negative for shortness of breath and wheezing.   Cardiovascular:  Negative for chest pain and palpitations.  Gastrointestinal:  Negative for abdominal pain, constipation, diarrhea, nausea and vomiting.  Genitourinary:  Negative for dysuria and hematuria.  Musculoskeletal:  Negative for arthralgias and back pain.  Skin:  Negative for color change and rash.  Neurological:  Negative for seizures and syncope.  All other systems reviewed and are negative.    Physical Exam Triage Vital Signs ED Triage Vitals  Encounter Vitals Group     BP 07/02/23 1253 118/81     Systolic BP Percentile --      Diastolic BP Percentile --      Pulse Rate 07/02/23 1253 68     Resp 07/02/23 1253 20     Temp 07/02/23 1253 97.8 F (36.6 C)     Temp Source 07/02/23 1253 Oral     SpO2 07/02/23 1253 97 %     Weight --      Height --      Head Circumference --      Peak Flow --      Pain Score 07/02/23 1252 0     Pain Loc --      Pain Education --      Exclude from Growth Chart --    No data found.  Updated Vital Signs BP 118/81 (BP Location: Right Arm)   Pulse 68   Temp 97.8 F (36.6 C) (Oral)   Resp 20   SpO2 97%   Visual Acuity Right Eye Distance:   Left Eye Distance:   Bilateral Distance:    Right Eye Near:   Left Eye Near:    Bilateral Near:     Physical Exam Vitals and nursing note reviewed.  Constitutional:      General: She is not  in acute distress.    Appearance: She is well-developed and overweight. She is not ill-appearing or toxic-appearing.  HENT:     Head: Normocephalic and atraumatic.     Right Ear: Hearing, tympanic membrane, ear canal and external ear normal.     Left Ear:  Hearing, tympanic membrane, ear canal and external ear normal.     Nose: Mucosal edema, congestion and rhinorrhea present. Rhinorrhea is clear.     Right Sinus: Maxillary sinus tenderness (Mild) present. No frontal sinus tenderness.     Left Sinus: Maxillary sinus tenderness (Mild) present. No frontal sinus tenderness.     Mouth/Throat:     Lips: Pink.     Mouth: Mucous membranes are moist.  Eyes:     Conjunctiva/sclera: Conjunctivae normal.     Pupils: Pupils are equal, round, and reactive to light.  Cardiovascular:     Rate and Rhythm: Normal rate and regular rhythm.     Heart sounds: S1 normal and S2 normal. No murmur heard. Pulmonary:     Effort: Pulmonary effort is normal. No respiratory distress.     Breath sounds: Examination of the right-upper field reveals wheezing. Examination of the left-upper field reveals wheezing. Wheezing (Rare and intermittent) present. No decreased breath sounds, rhonchi or rales.  Abdominal:     Palpations: Abdomen is soft.     Tenderness: There is no abdominal tenderness.  Musculoskeletal:        General: No swelling.     Cervical back: Neck supple.  Lymphadenopathy:     Head:     Right side of head: No submental, submandibular, tonsillar, preauricular or posterior auricular adenopathy.     Left side of head: No submental, submandibular, tonsillar, preauricular or posterior auricular adenopathy.     Cervical: No cervical adenopathy.     Right cervical: No superficial cervical adenopathy.    Left cervical: No superficial cervical adenopathy.  Skin:    General: Skin is warm and dry.     Capillary Refill: Capillary refill takes less than 2 seconds.     Findings: No rash.  Neurological:      Mental Status: She is alert and oriented to person, place, and time.  Psychiatric:        Mood and Affect: Mood normal.      UC Treatments / Results  Labs (all labs ordered are listed, but only abnormal results are displayed) Labs Reviewed - No data to display  EKG   Radiology No results found.  Procedures Procedures (including critical care time)  Medications Ordered in UC Medications - No data to display  Initial Impression / Assessment and Plan / UC Course  I have reviewed the triage vital signs and the nursing notes.  Pertinent labs & imaging results that were available during my care of the patient were reviewed by me and considered in my medical decision making (see chart for details).  Viral upper respiratory infection versus early bronchitis.  Will add prednisone, 20 mg, daily for 5 days.  Reviewed the risk benefits and alternatives of prednisone use.  Stop the benzonatate.  Promethazine DM, 5 mL, every 6 hours, as needed for cough.  Get plenty of fluids and rest.  Follow-up if symptoms do not improve, worsen or new symptoms occur. Final Clinical Impressions(s) / UC Diagnoses   Final diagnoses:  Viral URI with cough     Discharge Instructions      Exam is consistent with viral upper respiratory infection versus early bronchitis.  Discontinue the benzonatate.  Start prednisone, 20 mg, daily for 5 days.  May use Promethazine DM, 1 teaspoon, every 6 hours as needed for cough.  Promethazine DM could make you drowsy, do not use and drive.  Get plenty of fluids and rest.  Follow-up if symptoms do not improve,  worsen or new symptoms occur.     ED Prescriptions     Medication Sig Dispense Auth. Provider   predniSONE (DELTASONE) 20 MG tablet Take 1 tablet (20 mg total) by mouth daily with breakfast for 5 days. 5 tablet Prescilla Sours, FNP   promethazine-dextromethorphan (PROMETHAZINE-DM) 6.25-15 MG/5ML syrup Take 5 mLs by mouth 4 (four) times daily as needed for  cough. 118 mL Prescilla Sours, FNP      PDMP not reviewed this encounter.   Prescilla Sours, FNP 07/02/23 1329

## 2023-07-02 NOTE — Discharge Instructions (Addendum)
Exam is consistent with viral upper respiratory infection versus early bronchitis.  Discontinue the benzonatate.  Start prednisone, 20 mg, daily for 5 days.  May use Promethazine DM, 1 teaspoon, every 6 hours as needed for cough.  Promethazine DM could make you drowsy, do not use and drive.  Get plenty of fluids and rest.  Follow-up if symptoms do not improve, worsen or new symptoms occur.

## 2023-10-24 ENCOUNTER — Encounter: Payer: Self-pay | Admitting: Cardiology

## 2023-11-10 ENCOUNTER — Encounter: Payer: Self-pay | Admitting: Cardiology

## 2023-11-10 ENCOUNTER — Ambulatory Visit: Attending: Cardiology | Admitting: Cardiology

## 2023-11-10 VITALS — BP 134/78 | HR 60 | Ht 67.0 in | Wt 217.0 lb

## 2023-11-10 DIAGNOSIS — E782 Mixed hyperlipidemia: Secondary | ICD-10-CM

## 2023-11-10 DIAGNOSIS — M7989 Other specified soft tissue disorders: Secondary | ICD-10-CM

## 2023-11-10 DIAGNOSIS — I10 Essential (primary) hypertension: Secondary | ICD-10-CM

## 2023-11-10 DIAGNOSIS — R001 Bradycardia, unspecified: Secondary | ICD-10-CM

## 2023-11-10 DIAGNOSIS — I3139 Other pericardial effusion (noninflammatory): Secondary | ICD-10-CM | POA: Diagnosis not present

## 2023-11-10 DIAGNOSIS — E66811 Obesity, class 1: Secondary | ICD-10-CM

## 2023-11-10 HISTORY — DX: Obesity, class 1: E66.811

## 2023-11-10 HISTORY — DX: Mixed hyperlipidemia: E78.2

## 2023-11-10 NOTE — Patient Instructions (Addendum)
 Medication Instructions:  Your physician recommends that you continue on your current medications as directed. Please refer to the Current Medication list given to you today.  *If you need a refill on your cardiac medications before your next appointment, please call your pharmacy*  Lab Work: TODAY: CMET, BNP If you have labs (blood work) drawn today and your tests are completely normal, you will receive your results only by: MyChart Message (if you have MyChart) OR A paper copy in the mail If you have any lab test that is abnormal or we need to change your treatment, we will call you to review the results.  Testing/Procedures: Your physician has requested that you have an echocardiogram - urgent. Echocardiography is a painless test that uses sound waves to create images of your heart. It provides your doctor with information about the size and shape of your heart and how well your heart's chambers and valves are working. This procedure takes approximately one hour. There are no restrictions for this procedure. Please do NOT wear cologne, perfume, aftershave, or lotions (deodorant is allowed). Please arrive 15 minutes prior to your appointment time.  Please note: We ask at that you not bring children with you during ultrasound (echo/ vascular) testing. Due to room size and safety concerns, children are not allowed in the ultrasound rooms during exams. Our front office staff cannot provide observation of children in our lobby area while testing is being conducted. An adult accompanying a patient to their appointment will only be allowed in the ultrasound room at the discretion of the ultrasound technician under special circumstances. We apologize for any inconvenience.   Follow-Up: At [facility], you and your health needs are our priority.  As part of our continuing mission to provide you with exceptional heart care, our providers are all part of one team.  This team includes your  primary Cardiologist (physician) and Advanced Practice Providers or APPs (Physician Assistants and Nurse Practitioners) who all work together to provide you with the care you need, when you need it.  Your next appointment:   2 month(s)  Provider:   Zoe Hinds, MD

## 2023-11-10 NOTE — Addendum Note (Signed)
 Addended by: Jeneen Mire on: 11/10/2023 11:30 AM   Modules accepted: Orders

## 2023-11-10 NOTE — Progress Notes (Signed)
 Cardiology Office Note:    Date:  11/10/2023   ID:  Alyssa Ortiz, DOB 05-Aug-1955, MRN [id number]  PCP:  Alyssa Boozer, NP  Cardiologist:  Alyssa Balzarine, MD   Referring MD: Alyssa Boozer, NP    ASSESSMENT:    1. Primary hypertension   2. Idiopathic pericardial effusion   3. Mixed dyslipidemia   4. Bradycardia   5. Obesity (BMI 30.0-34.9)    PLAN:    In order of problems listed above:  Primary prevention stressed with the patient.  Importance of compliance with diet medication stressed and patient verbalized standing. Pericardial effusion: I reviewed records from the past and in view of new symptomatology we will do a echocardiogram to assess this. History of pedal edema: Will do a Chem-7 and a BNP to assess for any heart failure issues.  She is otherwise asymptomatic.  Her only issue is her pedal edema.  No shortness of breath or any such problems. Mixed dyslipidemia: On lipid-lowering medications followed by primary care.  Diet emphasized. Obesity: Weight reduction stressed diet emphasized.  Risks of obesity visited.  She promises to do better. Patient will be seen in follow-up appointment in 2 months or earlier if the patient has any concerns with Alyssa Ortiz.   Medication Adjustments/Labs and Tests Ordered: Current medicines are reviewed at length with the patient today.  Concerns regarding medicines are outlined above.  Orders Placed This Encounter  Procedures   Comprehensive Metabolic Panel (CMET)   Pro b natriuretic peptide (BNP)   ECHOCARDIOGRAM COMPLETE   No orders of the defined types were placed in this encounter.    No chief complaint on file.    History of Present Illness:    Alyssa Ortiz is a 68 y.o. female.  Patient is previously unknown to me.  She follows Alyssa Ortiz.  She has a history of idiopathic pericardial effusion, essential hypertension, mixed dyslipidemia and obesity.  Her only issue now is some pedal edema happening in the past  3 to 4 months.  No chest pain orthopnea or PND.  I discussed with her about salt intake issues and she tells me that her salt intake is fairly stable.  At the time of my evaluation, the patient is alert awake oriented and in no distress.  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   colchicine  0.6 MG tablet Take 1 tablet (0.6 mg total) by mouth daily. Please take this medication 3 times per week for 2 weeks then switch to daily.   meloxicam (MOBIC) 15 MG tablet Take 15 mg by mouth daily as needed for pain.   potassium chloride (KLOR-CON) 10 MEQ tablet Take 10 mEq by mouth daily.   rosuvastatin (CRESTOR) 20 MG tablet Take 20 mg by mouth daily.   torsemide  (DEMADEX ) 20 MG tablet TAKE ONE TABLET BY MOUTH DAILY  Allergies:   Patient has no known allergies.   Social History   Socioeconomic History   Marital status: Married    Spouse name: Not on file   Number of children: Not on file   Years of education: Not on file   Highest education level: Not on file  Occupational History   Not on file  Tobacco Use   Smoking status: Never    Passive exposure: Past   Smokeless tobacco: Never  Vaping Use   Vaping status: Never Used  Substance and Sexual Activity   Alcohol use: Yes   Drug use: Never   Sexual activity: Not on file  Other Topics Concern   Not on file  Social History Narrative   Not on file    Social Drivers of Health   Financial Resource Strain: Not on file  Food Insecurity: Not on file  Transportation Needs: Not on file  Physical Activity: Not on file  Stress: Not on file  Social Connections: Not on file     Family History: The patient's family history includes Alzheimer's disease in her mother; Congestive Heart Failure in her maternal grandfather; Healthy in her father; Heart Problems in her maternal grandmother; Liver cancer in her paternal grandfather; Other (age of onset: 69) in her paternal grandmother.  ROS:   Please see the history of present illness.    All other systems reviewed and are negative.  EKGs/Labs/Other Studies Reviewed:    The following studies were reviewed to .Aaron Aas   I discussed my findings with the patient at length   Recent Labs: 04/04/2023: BUN 18; Creatinine, Ser 0.88; Potassium 3.5; Sodium 143  Recent Lipid Panel No results found for: "CHOL", "TRIG", "HDL", "CHOLHDL", "VLDL", "LDLCALC", "LDLDIRECT"  Physical Exam:    VS:  BP 134/78   Pulse 60   Ht 5\' 7"  (1.702 m)   Wt 217 lb (98.4 kg)   SpO2 97%   BMI 33.99 kg/m     Wt Readings from Last 3 Encounters:  11/10/23 217 lb (98.4 kg)  05/28/23 210 lb (95.3 kg)  03/07/23 216 lb 6.4 oz (98.2 kg)     GEN: Patient is in no acute distress HEENT: Normal NECK: No JVD; No carotid bruits LYMPHATICS: No lymphadenopathy CARDIAC: Hear sounds regular, 2/6 systolic murmur at the apex. RESPIRATORY:  Clear to auscultation without rales, wheezing or rhonchi  ABDOMEN: Soft, non-tender, non-distended MUSCULOSKELETAL: Mild pedal edema; No deformity  SKIN: Warm and dry NEUROLOGIC:  Alert and oriented x 3 PSYCHIATRIC:  Normal affect   Signed, Alyssa Balzarine, MD  11/10/2023 10:40 AM    [facility]

## 2023-11-11 LAB — COMPREHENSIVE METABOLIC PANEL WITH GFR
ALT: 18 IU/L (ref 0–32)
AST: 16 IU/L (ref 0–40)
Albumin: 4 g/dL (ref 3.9–4.9)
Alkaline Phosphatase: 84 IU/L (ref 44–121)
BUN/Creatinine Ratio: 23 (ref 12–28)
BUN: 18 mg/dL (ref 8–27)
Bilirubin Total: 0.4 mg/dL (ref 0.0–1.2)
CO2: 25 mmol/L (ref 20–29)
Calcium: 9.2 mg/dL (ref 8.7–10.3)
Chloride: 104 mmol/L (ref 96–106)
Creatinine, Ser: 0.77 mg/dL (ref 0.57–1.00)
Globulin, Total: 2.1 g/dL (ref 1.5–4.5)
Glucose: 97 mg/dL (ref 70–99)
Potassium: 4 mmol/L (ref 3.5–5.2)
Sodium: 142 mmol/L (ref 134–144)
Total Protein: 6.1 g/dL (ref 6.0–8.5)
eGFR: 83 mL/min/{1.73_m2} (ref >59)

## 2023-11-11 LAB — PRO B NATRIURETIC PEPTIDE: NT-Pro BNP: 89 pg/mL (ref 0–301)

## 2023-11-13 DIAGNOSIS — R001 Bradycardia, unspecified: Secondary | ICD-10-CM | POA: Diagnosis not present

## 2023-11-13 DIAGNOSIS — I3139 Other pericardial effusion (noninflammatory): Secondary | ICD-10-CM | POA: Diagnosis not present

## 2023-11-14 ENCOUNTER — Ambulatory Visit: Payer: Self-pay | Admitting: Cardiology

## 2023-11-15 ENCOUNTER — Encounter: Payer: Self-pay | Admitting: Cardiology

## 2023-11-17 ENCOUNTER — Encounter: Payer: Self-pay | Admitting: Cardiology

## 2023-11-17 NOTE — Telephone Encounter (Signed)
 Echo will be scanned in to the chart.

## 2024-01-10 ENCOUNTER — Ambulatory Visit: Attending: Cardiology | Admitting: Cardiology

## 2024-01-10 ENCOUNTER — Encounter: Payer: Self-pay | Admitting: Cardiology

## 2024-01-10 VITALS — BP 120/68 | HR 58 | Ht 67.0 in | Wt 217.4 lb

## 2024-01-10 DIAGNOSIS — I3139 Other pericardial effusion (noninflammatory): Secondary | ICD-10-CM

## 2024-01-10 DIAGNOSIS — E782 Mixed hyperlipidemia: Secondary | ICD-10-CM

## 2024-01-10 DIAGNOSIS — E66811 Obesity, class 1: Secondary | ICD-10-CM

## 2024-01-10 NOTE — Patient Instructions (Signed)
Medication Instructions:  Your physician recommends that you continue on your current medications as directed. Please refer to the Current Medication list given to you today.  *If you need a refill on your cardiac medications before your next appointment, please call your pharmacy*   Lab Work: None ordered If you have labs (blood work) drawn today and your tests are completely normal, you will receive your results only by: MyChart Message (if you have MyChart) OR A paper copy in the mail If you have any lab test that is abnormal or we need to change your treatment, we will call you to review the results.   Testing/Procedures: None ordered   Follow-Up: At [facility], you and your health needs are our priority.  As part of our continuing mission to provide you with exceptional heart care, we have created designated Provider Care Teams.  These Care Teams include your primary Cardiologist (physician) and Advanced Practice Providers (APPs -  Physician Assistants and Nurse Practitioners) who all work together to provide you with the care you need, when you need it.  We recommend signing up for the patient portal called "MyChart".  Sign up information is provided on this After Visit Summary.  MyChart is used to connect with patients for Virtual Visits (Telemedicine).  Patients are able to view lab/test results, encounter notes, upcoming appointments, etc.  Non-urgent messages can be sent to your provider as well.   To learn more about what you can do with MyChart, go to [email or web address].    Your next appointment:   3 month(s)  The format for your next appointment:   In Person  Provider:   Brian Munley, MD    Other Instructions none  Important Information About Sugar      

## 2024-01-10 NOTE — Progress Notes (Addendum)
 Cardiology Office Note:    Date:  01/10/2024   ID:  Alyssa Ortiz, DOB [birth year], MRN [id number]  PCP:  Pandora Therisa RAMAN, NP  Cardiologist:  Jennifer JONELLE Crape, MD   Referring MD: Pandora Therisa RAMAN, NP    ASSESSMENT:    1. Idiopathic pericardial effusion   2. Mixed dyslipidemia   3. Obesity (BMI 30.0-34.9)    PLAN:    In order of problems listed above:  Primary prevention stressed with the patient.  Importance of compliance with diet medication stressed and patient verbalized standing.  She has excellent effort tolerance and I told her to continue her exercise pattern. Idiopathic pericardial effusion: Stable at this time.  Echo from [place] reviewed.  She wants me to check with Dr. Monetta to see if she needs to continue the colchicine .  She has seen him in the past and wants to follow-up with him.  I will send him a copy of this note and check for his opinion on this and let the patient know. Obesity: diet emphasized and she promises to do better. Mixed dyslipidemia: On lipid-lowering medications send I reviewed lipids from [facility] sheet and found them to be fine. Patient will be seen in follow-up appointment in 3 months with Dr. Monetta or earlier if the patient has any concerns.  I sent a message via secure chat to Dr. Monetta.  He reviewed the patient's chart and recommends continue current medications.  He will follow-up in 3 months and at that time we will do an echocardiogram and other lab work for inflammatory markers such as possible CRP.  I conveyed this to the patient.    Medication Adjustments/Labs and Tests Ordered: Current medicines are reviewed at length with the patient today.  Concerns regarding medicines are outlined above.  No orders of the defined types were placed in this encounter.  No orders of the defined types were placed in this encounter.    No chief complaint on file.    History of Present Illness:    Alyssa Ortiz is a 68 y.o. female.  Patient  has past medical history of idiopathic pericardial effusion, mixed dyslipidemia and obesity.  She has no symptoms at this time.  Her symptoms are stable.  She denies any chest pain orthopnea or PND.  At the time of my evaluation, the patient is alert awake oriented and in no distress.  She exercises on a regular basis.  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   colchicine  0.6 MG tablet Take 1 tablet (0.6 mg total) by mouth daily. Please take this medication 3 times per week for 2 weeks then switch to daily.   meloxicam (MOBIC) 15 MG tablet Take 15 mg by mouth daily as needed for pain.   potassium chloride (KLOR-CON) 10 MEQ tablet Take  10 mEq by mouth daily.   rosuvastatin (CRESTOR) 20 MG tablet Take 20 mg by mouth daily.   torsemide  (DEMADEX ) 20 MG tablet TAKE ONE TABLET BY MOUTH DAILY     Allergies:   Patient has no known allergies.   Social History   Socioeconomic History   Marital status: Married    Spouse name: Not on file   Number of children: Not on file   Years of education: Not on file   Highest education level: Not on file  Occupational History   Not on file  Tobacco Use   Smoking status: Never    Passive exposure: Past   Smokeless tobacco: Never  Vaping Use   Vaping status: Never Used  Substance and Sexual Activity    Alcohol use: Yes   Drug use: Never   Sexual activity: Not on file  Other Topics Concern   Not on file  Social History Narrative   Not on file   Social Drivers of Health   Financial Resource Strain: Not on file  Food Insecurity: Not on file  Transportation Needs: Not on file  Physical Activity: Not on file  Stress: Not on file  Social Connections: Not on file     Family History: The patient's family history includes Alzheimer's disease in her mother; Congestive Heart Failure in her maternal grandfather; Healthy in her father; Heart Problems in her maternal grandmother; Liver cancer in her paternal grandfather; Other (age of onset: 79) in her paternal grandmother.  ROS:   Please see the history of present illness.    All other systems reviewed and are negative.  EKGs/Labs/Other Studies Reviewed:    The following studies were reviewed today: .SABRA   I discussed my findings with the patient at length   Recent Labs: 11/10/2023: ALT 18; BUN 18; Creatinine, Ser 0.77; NT-Pro BNP 89; Potassium 4.0; Sodium 142  Recent Lipid Panel No results found for: CHOL, TRIG, HDL, CHOLHDL, VLDL, LDLCALC, LDLDIRECT  Physical Exam:    VS:  BP 120/68   Pulse (!) 58   Ht 5' 7 (1.702 m)   Wt 217 lb 6.4 oz (98.6 kg)   SpO2 96%   BMI 34.05 kg/m     Wt Readings from Last 3 Encounters:  01/10/24 217 lb 6.4 oz (98.6 kg)  11/10/23 217 lb (98.4 kg)  05/28/23 210 lb (95.3 kg)     GEN: Patient is in no acute distress HEENT: Normal NECK: No JVD; No carotid bruits LYMPHATICS: No lymphadenopathy CARDIAC: Hear sounds regular, 2/6 systolic murmur at the apex. RESPIRATORY:  Clear to auscultation without rales, wheezing or rhonchi  ABDOMEN: Soft, non-tender, non-distended MUSCULOSKELETAL:  No edema; No deformity  SKIN: Warm and dry NEUROLOGIC:  Alert and oriented x 3 PSYCHIATRIC:  Normal affect   Signed, Jennifer JONELLE Crape, MD  01/10/2024 8:51 AM    [facility]

## 2024-01-19 ENCOUNTER — Other Ambulatory Visit: Payer: Self-pay | Admitting: Medical Genetics

## 2024-01-24 ENCOUNTER — Ambulatory Visit (INDEPENDENT_AMBULATORY_CARE_PROVIDER_SITE_OTHER): Admit: 2024-01-24 | Discharge: 2024-01-24 | Disposition: A | Discharge status: Home / Self Care | Admitting: Radiology

## 2024-01-24 ENCOUNTER — Encounter (HOSPITAL_BASED_OUTPATIENT_CLINIC_OR_DEPARTMENT_OTHER): Payer: Self-pay

## 2024-01-24 ENCOUNTER — Ambulatory Visit (HOSPITAL_BASED_OUTPATIENT_CLINIC_OR_DEPARTMENT_OTHER)
Admission: EM | Admit: 2024-01-24 | Discharge: 2024-01-24 | Disposition: A | Discharge status: Home / Self Care | Attending: Family Medicine | Admitting: Family Medicine

## 2024-01-24 DIAGNOSIS — M25561 Pain in right knee: Secondary | ICD-10-CM | POA: Diagnosis not present

## 2024-01-24 MED ORDER — KETOROLAC TROMETHAMINE 15 MG/ML IJ SOLN
15.0000 mg | Freq: Once | INTRAMUSCULAR | Status: AC
  Administered 2024-01-24: 15 mg via INTRAMUSCULAR

## 2024-01-24 NOTE — ED Provider Notes (Signed)
 [facility]    CSN: [id number] Arrival date & time: 01/24/24  1254      History   Chief Complaint Chief Complaint  Patient presents with   Fall    HPI Alyssa Ortiz is a 68 y.o. female.   Pt is a 68 year old female that presents with right knee pain/injury. Pt c/o falling today at the gym. Her bag got caught on a door and she fell forward and landed on her right knee. She is able to bear some weight but states it hurts too much to walk on it. She has noticed swelling and been applying ice which seems to help. Pain is described as an ache and denies radiating. She has not taken anything.   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    Patient Active Problem List   Diagnosis Date Noted   Mixed dyslipidemia 11/10/2023   Obesity (BMI 30.0-34.9) 11/10/2023   Heart murmur 03/06/2023   Hypertension 09/22/2021   Meningioma (HCC) 09/22/2021   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      Home Medications    Prior to Admission medications   Medication Sig Start Date End Date Taking? Authorizing Provider  buPROPion (WELLBUTRIN XL) 150 MG 24 hr tablet Take 450 mg by mouth daily.    [provider]  colchicine  0.6  MG tablet Take 1 tablet (0.6 mg total) by mouth daily. Please take this medication 3 times per week for 2 weeks then switch to daily. 03/07/23   Monetta Redell PARAS, MD  meloxicam (MOBIC) 15 MG tablet Take 15 mg by mouth daily as needed for pain. 05/14/21   [provider]  potassium chloride (KLOR-CON) 10 MEQ tablet Take 10 mEq by mouth daily. 07/30/21   [provider]  rosuvastatin (CRESTOR) 20 MG tablet Take 20 mg by mouth daily. 07/30/21   [provider]  torsemide  (DEMADEX ) 20 MG tablet TAKE ONE TABLET BY MOUTH DAILY 05/19/23   Monetta Redell PARAS, MD    Family History Family History  Problem Relation Age of Onset   Alzheimer's disease Mother    Healthy Father    Heart Problems Maternal Grandmother        Tumor in heart   Congestive Heart Failure Maternal Grandfather        smoker   Other Paternal Grandmother 76       Fell broke neck   Liver cancer Paternal Grandfather     Social History Social History   Tobacco Use   Smoking status: Never    Passive exposure: Past   Smokeless tobacco: Never  Vaping Use   Vaping status: Never Used  Substance Use Topics   Alcohol use: Yes  Drug use: Never     Allergies   Patient has no known allergies.   Review of Systems Review of Systems See HPI  Physical Exam Triage Vital Signs ED Triage Vitals  Encounter Vitals Group     BP 01/24/24 1316 123/83     Girls Systolic BP Percentile --      Girls Diastolic BP Percentile --      Boys Systolic BP Percentile --      Boys Diastolic BP Percentile --      Pulse Rate 01/24/24 1316 80     Resp 01/24/24 1316 20     Temp 01/24/24 1316 97.8 F (36.6 C)     Temp Source 01/24/24 1316 Oral     SpO2 01/24/24 1316 96 %     Weight --      Height --      Head Circumference --      Peak Flow --      Pain Score 01/24/24 1313 8     Pain Loc --      Pain Education --      Exclude from Growth Chart --    No data found.  Updated Vital Signs BP 123/83 (BP Location:  Left Arm)   Pulse 80   Temp 97.8 F (36.6 C) (Oral)   Resp 20   SpO2 96%   Visual Acuity Right Eye Distance:   Left Eye Distance:   Bilateral Distance:    Right Eye Near:   Left Eye Near:    Bilateral Near:     Physical Exam Vitals and nursing note reviewed.  Constitutional:      General: She is not in acute distress.    Appearance: Normal appearance. She is not ill-appearing, toxic-appearing or diaphoretic.  Pulmonary:     Effort: Pulmonary effort is normal.  Musculoskeletal:        General: Swelling, tenderness and signs of injury present.     Comments: Generalized swelling and pain more medial and pre patellar. TTP. Limited ROM   Skin:    General: Skin is warm and dry.  Neurological:     Mental Status: She is alert.  Psychiatric:        Mood and Affect: Mood normal.      UC Treatments / Results  Labs (all labs ordered are listed, but only abnormal results are displayed) Labs Reviewed - No data to display  EKG   Radiology DG Knee Complete 4 Views Right Result Date: 01/24/2024 CLINICAL DATA:  knee pain/ fall EXAM: RIGHT KNEE - COMPLETE 4+ VIEW COMPARISON:  None Available. FINDINGS: Well-aligned knee arthroplasty without acute fracture.Osteopenia.No dislocation. Moderate soft tissue swelling involving the prepatellar soft tissues of the knee. IMPRESSION: Moderate soft tissue swelling involving the prepatellar soft tissues of the knee. Well-aligned total knee arthroplasty is intact without acute fracture or dislocation.No periprosthetic lucency to suggest loosening. Electronically Signed   By: Rogelia Myers M.D.   On: 01/24/2024 13:41    Procedures Procedures (including critical care time)  Medications Ordered in UC Medications  ketorolac  (TORADOL ) 15 MG/ML injection 15 mg (15 mg Intramuscular Given 01/24/24 1403)    Initial Impression / Assessment and Plan / UC Course  I have reviewed the triage vital signs and the nursing notes.  Pertinent labs & imaging  results that were available during my care of the patient were reviewed by me and considered in my medical decision making (see chart for details).     Knee pain- x ray  without any concerns besides soft tissue swelling. Hardware from knee replacement in tact.  Recommend RICE over the next week with non weight bearing.  Toradol  given here for pain. She can take extra strength tylenol in addition if needed.  See ortho for continued issues.  Final Clinical Impressions(s) / UC Diagnoses   Final diagnoses:  Acute pain of right knee     Discharge Instructions      Your x-ray did not show any concerns with your knee replacement.  You do have soft tissue swelling.  Recommend rest, ice, elevation.  Try to be nonweightbearing is much as possible over the next week. Toradol  for pain and inflammation here today.  Skip the meloxicam tonight unless you feel like the toradol  is wearing off.  You can take extra strength Tylenol for pain    ED Prescriptions   None    PDMP not reviewed this encounter.   Adah Wilbert LABOR, FNP 01/24/24 1415

## 2024-01-24 NOTE — ED Triage Notes (Signed)
 Pt c/o falling today at the gym. Her bag got caught on a door and she fell forward and landed on her right knee. She is able to bear some weight but states it hurts too much to walk on it. She has noticed swelling and been applying ice which seems to help. Pain is described as an ache and denies radiating. She has not taken anything.

## 2024-01-24 NOTE — Discharge Instructions (Signed)
 Your x-ray did not show any concerns with your knee replacement.  You do have soft tissue swelling.  Recommend rest, ice, elevation.  Try to be nonweightbearing is much as possible over the next week. Toradol  for pain and inflammation here today.  Skip the meloxicam tonight unless you feel like the toradol  is wearing off.  You can take extra strength Tylenol for pain

## 2024-01-30 ENCOUNTER — Other Ambulatory Visit

## 2024-01-30 DIAGNOSIS — Z006 Encounter for examination for normal comparison and control in clinical research program: Secondary | ICD-10-CM

## 2024-02-09 LAB — GENECONNECT MOLECULAR SCREEN: Genetic Analysis Overall Interpretation: NEGATIVE

## 2024-02-10 ENCOUNTER — Encounter (INDEPENDENT_AMBULATORY_CARE_PROVIDER_SITE_OTHER): Payer: Self-pay

## 2024-02-15 ENCOUNTER — Other Ambulatory Visit (HOSPITAL_BASED_OUTPATIENT_CLINIC_OR_DEPARTMENT_OTHER): Payer: Self-pay

## 2024-02-15 ENCOUNTER — Encounter (HOSPITAL_BASED_OUTPATIENT_CLINIC_OR_DEPARTMENT_OTHER): Payer: Self-pay

## 2024-02-15 ENCOUNTER — Ambulatory Visit (HOSPITAL_BASED_OUTPATIENT_CLINIC_OR_DEPARTMENT_OTHER)
Admission: RE | Admit: 2024-02-15 | Discharge: 2024-02-15 | Disposition: A | Discharge status: Home / Self Care | Source: Ambulatory Visit | Attending: Family Medicine | Admitting: Family Medicine

## 2024-02-15 ENCOUNTER — Ambulatory Visit (HOSPITAL_BASED_OUTPATIENT_CLINIC_OR_DEPARTMENT_OTHER): Admitting: Radiology

## 2024-02-15 VITALS — BP 120/78 | HR 65 | Temp 97.9°F | Resp 20

## 2024-02-15 DIAGNOSIS — M79671 Pain in right foot: Secondary | ICD-10-CM

## 2024-02-15 DIAGNOSIS — W010XXD Fall on same level from slipping, tripping and stumbling without subsequent striking against object, subsequent encounter: Secondary | ICD-10-CM

## 2024-02-15 MED ORDER — MELOXICAM 15 MG PO TABS
15.0000 mg | ORAL_TABLET | Freq: Every day | ORAL | 0 refills | Status: AC | PRN
  Filled 2024-02-15: qty 15, 15d supply, fill #0

## 2024-02-15 NOTE — ED Provider Notes (Signed)
 [facility]    CSN: [id number] Arrival date & time: 02/15/24  0905      History   Chief Complaint Chief Complaint  Patient presents with   Foot Pain    HPI Alyssa Ortiz is a 68 y.o. female.   68 year old female who had a fall on 01/24/2024.  She was seen here at the urgent care and had a knee x-ray because her main pain was right knee pain but her fall caused her to go forward and she landed on her knee but her ankle was bent up really tight underneath her.  Her knee is slowly improving since that date but her right foot continues to hurt and even seems worse.  She played pickle ball yesterday and also did some pool work and had quite a bit of pain at the end of her workouts.  She has not taken anything for pain today but does take meloxicam  and acetaminophen for pain as needed.  She is concerned she might have some kind of a hairline fracture or injury that she was not aware of at the time of the original fall.   Foot Pain Pertinent negatives include no chest pain and no abdominal pain.    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      Home Medications    Prior to Admission medications   Medication Sig Start Date End Date Taking? Authorizing Provider  buPROPion (WELLBUTRIN XL) 150 MG 24 hr tablet Take 450 mg by mouth daily.    [provider]  colchicine  0.6 MG tablet Take 1 tablet (0.6 mg total) by mouth daily. Please take this medication 3 times per week for 2 weeks then switch to daily. 03/07/23   Monetta Redell PARAS, MD  meloxicam  (MOBIC ) 15 MG tablet Take 1 tablet (15 mg total) by mouth daily as needed for pain. 02/15/24   Ival Domino, FNP  potassium chloride (KLOR-CON) 10 MEQ tablet Take 10 mEq by mouth daily. 07/30/21   [provider]  rosuvastatin (CRESTOR) 20 MG tablet Take 20 mg by mouth daily. 07/30/21   [provider]  torsemide  (DEMADEX ) 20 MG tablet TAKE ONE TABLET BY MOUTH DAILY 05/19/23   Monetta Redell PARAS, MD    Family History Family History  Problem Relation Age of Onset   Alzheimer's disease Mother    Healthy Father    Heart Problems Maternal Grandmother        Tumor in heart   Congestive  Heart Failure Maternal Grandfather        smoker   Other Paternal Grandmother 35       Fell broke neck   Liver cancer Paternal Grandfather     Social History Social History   Tobacco Use   Smoking status: Never    Passive exposure: Past   Smokeless tobacco: Never  Vaping Use   Vaping status: Never Used  Substance Use Topics   Alcohol use: Yes   Drug use: Never     Allergies   Patient has no known allergies.   Review of Systems Review of Systems  Constitutional:  Negative for fever.  Respiratory:  Negative for cough.   Cardiovascular:  Negative for chest pain.  Gastrointestinal:  Negative for abdominal pain, constipation, diarrhea, nausea and vomiting.  Musculoskeletal:  Positive for arthralgias (Right foot pain).  Negative for back pain.  Skin:  Negative for color change and rash.  Neurological:  Negative for syncope.  All other systems reviewed and are negative.    Physical Exam Triage Vital Signs ED Triage Vitals  Encounter Vitals Group     BP 02/15/24 0931 120/78     Girls Systolic BP Percentile --      Girls Diastolic BP Percentile --      Boys Systolic BP Percentile --      Boys Diastolic BP Percentile --      Pulse Rate 02/15/24 0931 65     Resp 02/15/24 0931 20     Temp 02/15/24 0931 97.9 F (36.6 C)     Temp Source 02/15/24 0931 Oral     SpO2 02/15/24 0931 95 %     Weight --      Height --      Head Circumference --      Peak Flow --      Pain Score 02/15/24 0929 4     Pain Loc --      Pain Education --      Exclude from Growth Chart --    No data found.  Updated Vital Signs BP 120/78 (BP Location: Right Arm)   Pulse 65   Temp 97.9 F (36.6 C) (Oral)   Resp 20   SpO2 95%   Visual Acuity Right Eye Distance:   Left Eye Distance:   Bilateral Distance:    Right Eye Near:   Left Eye Near:    Bilateral Near:     Physical Exam Vitals and nursing note reviewed.  Constitutional:      General: She is not in acute distress.    Appearance: She is well-developed. She is not ill-appearing or toxic-appearing.  HENT:     Head: Normocephalic and atraumatic.     Right Ear: External ear normal.     Left Ear: External ear normal.     Nose: Nose normal.     Mouth/Throat:     Lips: Pink.     Mouth: Mucous membranes are moist.  Eyes:     Conjunctiva/sclera: Conjunctivae normal.     Pupils: Pupils are equal, round, and reactive to light.  Cardiovascular:     Rate and Rhythm: Normal rate and regular rhythm.     Pulses:          Dorsalis pedis pulses are 2+ on the right side and 2+ on the left side.       Posterior tibial pulses are 2+ on the right side and 2+ on the left side.     Heart sounds: S1  normal and S2 normal. No murmur heard. Pulmonary:     Effort: Pulmonary  effort is normal. No respiratory distress.     Breath sounds: Normal breath sounds. No decreased breath sounds, wheezing, rhonchi or rales.  Musculoskeletal:        General: No swelling.     Right upper leg: Normal.     Left upper leg: Normal.     Right knee: Normal.     Left knee: Normal.     Right lower leg: Normal.     Left lower leg: Normal.     Right ankle: Normal.     Left ankle: Normal.     Right foot: Normal range of motion and normal capillary refill. Tenderness (On the midfoot near the ankle and on the right lateral midfoot) and bony tenderness (Right lateral midfoot) present. No swelling, deformity, bunion, Charcot foot, foot drop, prominent metatarsal heads, laceration or crepitus. Normal pulse.  Feet:     Right foot:     Skin integrity: Skin integrity normal.     Left foot:     Skin integrity: Skin integrity normal.  Skin:    General: Skin is warm and dry.     Capillary Refill: Capillary refill takes less than 2 seconds.     Findings: No rash.  Neurological:     Mental Status: She is alert and oriented to person, place, and time.  Psychiatric:        Mood and Affect: Mood normal.      UC Treatments / Results  Labs (all labs ordered are listed, but only abnormal results are displayed) Comprehensive Metabolic Panel (CMET): 11/10/23:     Component Ref Range & Units (hover) 3 mo ago  Glucose 97  BUN 18  Creatinine, Ser 0.77  eGFR 83  BUN/Creatinine Ratio 23  Sodium 142  Potassium 4.0  Chloride 104  CO2 25  Calcium 9.2  Total Protein 6.1  Albumin 4.0  Globulin, Total 2.1  Bilirubin Total 0.4  Alkaline Phosphatase 84  AST 16  ALT 18  Resulting Agency [facility]      EKG   Radiology DG Foot Complete Right Result Date: 02/15/2024 CLINICAL DATA:  Right foot pain. Sharp shooting pain to the top of right foot after falling approximately 3 weeks ago. EXAM: RIGHT FOOT COMPLETE - 3+ VIEW COMPARISON:  None Available. FINDINGS: The bones appear mildly  demineralized. No evidence of acute fracture or dislocation. There are mild degenerative changes in the midfoot and at the 1st metatarsophalangeal joint. There are bidirectional calcaneal spurs. No focal soft tissue abnormalities are identified. IMPRESSION: No evidence of acute fracture or dislocation. Mild degenerative changes as described. Electronically Signed   By: Elsie Perone M.D.   On: 02/15/2024 10:42    Procedures Procedures (including critical care time)  Medications Ordered in UC Medications - No data to display  Initial Impression / Assessment and Plan / UC Course  I have reviewed the triage vital signs and the nursing notes.  Pertinent labs & imaging results that were available during my care of the patient were reviewed by me and considered in my medical decision making (see chart for details).  Plan of Care: Right foot pain after a fall: X-rays are normal.  Encouraged RICE therapy and an elastic brace on the foot.  Encouraged meloxicam  15 mg daily for pain.  See discharge instructions for more information.  Follow-up if symptoms do not improve, worsen or new symptoms occur.  I reviewed the plan of  care with the patient and/or the patient's guardian.  The patient and/or guardian had time to ask questions and acknowledged that the questions were answered.  I provided instruction on symptoms or reasons to return here or to go to an ER, if symptoms/condition did not improve, worsened or if new symptoms occurred.  Final Clinical Impressions(s) / UC Diagnoses   Final diagnoses:  Fall on same level from slipping, tripping or stumbling, subsequent encounter  Right foot pain     Discharge Instructions      Right foot pain after a fall: X-rays are negative but do show arthritis.  Encouraged elastic foot wrap for support.  Encouraged ice and elevation.  Meloxicam  15 mg once daily if needed for pain.  Follow-up if symptoms do not improve, worsen or new symptoms  occur     ED Prescriptions     Medication Sig Dispense Auth. Provider   meloxicam  (MOBIC ) 15 MG tablet Take 1 tablet (15 mg total) by mouth daily as needed for pain. 15 tablet Sundee Garland, FNP      PDMP not reviewed this encounter.   Ival Domino, FNP 02/15/24 1055

## 2024-02-15 NOTE — ED Triage Notes (Signed)
 Pt states she is having a sharp shooting pain to the top of her right foot. She had a fall on 01/24/24 and had slight discomfort at that time but her knee was the main concern that day. Since then she has had increased right foot pain. Pain is worst when she is walking on it. She has been having radiating discomfort going up her leg. Pt has not taken anything for the pain today. She does take meloxicam  and tylenol at night for the pain.

## 2024-02-15 NOTE — Discharge Instructions (Addendum)
 Right foot pain after a fall: X-rays are negative but do show arthritis.  Encouraged elastic foot wrap for support.  Encouraged ice and elevation.  Meloxicam  15 mg once daily if needed for pain.  Follow-up if symptoms do not improve, worsen or new symptoms occur

## 2024-02-19 ENCOUNTER — Other Ambulatory Visit: Payer: Self-pay | Admitting: Nurse Practitioner

## 2024-02-19 DIAGNOSIS — Z1231 Encounter for screening mammogram for malignant neoplasm of breast: Secondary | ICD-10-CM

## 2024-02-21 ENCOUNTER — Ambulatory Visit (INDEPENDENT_AMBULATORY_CARE_PROVIDER_SITE_OTHER)

## 2024-02-21 ENCOUNTER — Ambulatory Visit: Payer: Self-pay | Admitting: Podiatry

## 2024-02-21 DIAGNOSIS — Z9181 History of falling: Secondary | ICD-10-CM

## 2024-02-21 DIAGNOSIS — M778 Other enthesopathies, not elsewhere classified: Secondary | ICD-10-CM

## 2024-02-21 DIAGNOSIS — R262 Difficulty in walking, not elsewhere classified: Secondary | ICD-10-CM | POA: Diagnosis not present

## 2024-02-21 DIAGNOSIS — M7751 Other enthesopathy of right foot: Secondary | ICD-10-CM | POA: Diagnosis not present

## 2024-02-21 DIAGNOSIS — M84374A Stress fracture, right foot, initial encounter for fracture: Secondary | ICD-10-CM

## 2024-02-21 NOTE — Progress Notes (Signed)
    Chief Complaint  Patient presents with   Foot Pain    R Foot dorsal pain Fell 4-5 weeks ago.Aug 20th Began getting more sore gradually went to urgent care last week. Sore Stabbing pain when taking weight off of foot.  Taking meloxicam  not helping enough taking tylenol with. Not diabetic no anti coag.   HPI: 68 y.o. female presents today with c/o pain to the outside of the right foot.  She fell 4 weeks ago and initially hurt her knee.  That is improving, but she now is having continued pain in the foot.  Pain is more noticeable once she gets off her foot.  It's not as bad when she's walking.  She goes to the [facility] and is very active.  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  Minimal to no edema on lateral right 5th metatarsal base area.  No gaps or nodules within the PB tendon.  Pain with resisted eversion in area of concern.  Epicritic sensation intact.  Antalgic gait noted.    Radiographic Exam (R foot, 3 WB views, 02/21/24):  Normal osseous mineralization. Bone callus / osteoblastic activity seen on lateral view in the diaphyseal-metaphyseal region of the 5th metatarsal.  Not seen on other views.  No radiolucency noted.    Assessment/Plan of Care: 1. Stress fracture of metatarsal bone of right foot, initial encounter   2. Capsulitis of right foot   3. History of  fall within past 90 days   4. Difficulty walking     Reviewed findings with patient and her husband.  She was fitted for pneumatic pre-fabricated camwalker today to wear at all times WB.  Proof of delivery form and insurance waiver signed via [employer].    She'll need to avoid the gym over the next 3-4 weeks while resting, icing, and wearing camwalker.  Will XR at that time and re-evaluate.    Awanda CHARM Imperial, DPM, FACFAS [facility]     [address] [place], [place] [address]                Office [phone]  Fax [phone]

## 2024-02-22 ENCOUNTER — Ambulatory Visit
Admission: RE | Admit: 2024-02-22 | Discharge: 2024-02-22 | Disposition: A | Discharge status: Home / Self Care | Source: Ambulatory Visit

## 2024-02-22 DIAGNOSIS — Z1231 Encounter for screening mammogram for malignant neoplasm of breast: Secondary | ICD-10-CM

## 2024-03-08 ENCOUNTER — Ambulatory Visit (INDEPENDENT_AMBULATORY_CARE_PROVIDER_SITE_OTHER): Admitting: Podiatry

## 2024-03-08 ENCOUNTER — Ambulatory Visit (INDEPENDENT_AMBULATORY_CARE_PROVIDER_SITE_OTHER)

## 2024-03-08 DIAGNOSIS — M84374A Stress fracture, right foot, initial encounter for fracture: Secondary | ICD-10-CM

## 2024-03-08 DIAGNOSIS — M84374D Stress fracture, right foot, subsequent encounter for fracture with routine healing: Secondary | ICD-10-CM | POA: Diagnosis not present

## 2024-03-08 NOTE — Progress Notes (Signed)
     Chief Complaint  Patient presents with   Fracture    Right foot follow up for FX re-eval and hopefully going back in to shoes. She did state she had just gotten out of the pool and that she has been gardening. Report very little pain but some achieness. When she takes off the boot and takes weight off the foot.  Not diabetic and no anti coag.    HPI: 68 y.o. female presents today for follow-up of right fifth metatarsal stress fracture.  She has been in the cam walker about 4 weeks.  She has no pain at all when wearing the cam walker.  She does have some pulling sensation when resting with the foot elevated.  She does want a get back to the gym/pool for water volleyball.    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  No edema is appreciated in the area of the proximal fifth metatarsal on the right foot.  There is still some pain on palpation to the proximal aspect of the fifth metatarsal in the area where the stress reaction is noted.  Radiographic Exam, right foot, 3 views weightbearing:  Normal osseous mineralization.  There is slightly more bone callus formation noted at the area of the stress reaction.  No radiolucency is noted extending across the fifth metatarsal.  Assessment/Plan of Care: 1. Stress fracture of right foot with routine healing, subsequent encounter     Will get her started with some  physical therapy and have her stay in the boot for another 7 to 10 days while she starts PT.  Will have them advise her on when it safe to return to the pool and gym and return to normal shoe gear.  Follow-up in approximately 4 to 5 weeks.   Awanda CHARM Imperial, DPM, FACFAS [facility]     [address] [place], [place] [address]                Office [phone]  Fax [phone]

## 2024-03-13 ENCOUNTER — Encounter: Payer: Self-pay | Admitting: Podiatry

## 2024-03-26 ENCOUNTER — Other Ambulatory Visit: Payer: Self-pay | Admitting: Cardiology

## 2024-04-02 ENCOUNTER — Other Ambulatory Visit: Payer: Self-pay | Admitting: Cardiology

## 2024-04-05 ENCOUNTER — Ambulatory Visit (INDEPENDENT_AMBULATORY_CARE_PROVIDER_SITE_OTHER): Admitting: Podiatry

## 2024-04-05 ENCOUNTER — Ambulatory Visit (INDEPENDENT_AMBULATORY_CARE_PROVIDER_SITE_OTHER)

## 2024-04-05 DIAGNOSIS — M84374D Stress fracture, right foot, subsequent encounter for fracture with routine healing: Secondary | ICD-10-CM

## 2024-04-05 NOTE — Progress Notes (Signed)
    Chief Complaint  Patient presents with   Fracture    Right foot, 5th met fracture recheck. Doing very well, stating she knows its there but as she has been using it and walking more there is no real pain. In her sneakers today.    HPI: 68 y.o. female presents today for follow-up of right fifth metatarsal fracture.  She has finished physical therapy.  She has returned to the gym.  She experiences an occasional ache from time to time.  Overall she feels that she is much improved.  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  No appreciable edema or ecchymosis is noted near the fifth metatarsal on the right foot.  No pain on palpation of the fifth metatarsal or the peroneus brevis tendon is noted.  Epicritic sensation is intact.  Radiographic Exam (right foot, 3 weightbearing views, 04/05/2024):  Normal osseous mineralization.  Right fifth metatarsal fracture appears completely resolved at this time.  No radiolucency noted within the fifth metatarsal.  Assessment/Plan of Care: 1. Stress fracture of right foot with routine healing, subsequent encounter    Patient informed that she can slowly resume all of her preinjury activities as tolerated.  Recommend only increasing workout activities by 10% each week.  Call the office if she starts to feel any return of pain to the area or sees any swelling or  bruising.  Follow-up as needed  Awanda CHARM Imperial, DPM, FACFAS [facility]     [address] [place], [place] [address]                Office [phone]  Fax [phone]

## 2024-04-14 NOTE — Progress Notes (Unsigned)
 Cardiology Office Note:    Date:  04/14/2024   ID:  Alyssa Ortiz, DOB 1955/06/09, MRN [id number]  PCP:  Pandora Therisa RAMAN, NP  Cardiologist:  Redell Leiter, MD    Referring MD: Pandora Therisa RAMAN, NP    ASSESSMENT:    1. Idiopathic pericardial effusion   2. Primary hypertension    PLAN:    In order of problems listed above:  Stable no obvious etiology not progressive asymptomatic and I think in this case close clinical observation is the best approach will repeat the echocardiogram in June and I will see her 6 months later.  If worsened symptomatic with pleuritic pericardial pain or any findings of cardiac compression drainage would be appropriate Stable continue her statin   Next appointment: 1 year   Medication Adjustments/Labs and Tests Ordered: Current medicines are reviewed at length with the patient today.  Concerns regarding medicines are outlined above.  No orders of the defined types were placed in this encounter.  No orders of the defined types were placed in this encounter.    History of Present Illness:    Alyssa Ortiz is a 68 y.o. female with a hx of chronic pericardial effusion hypertension hyperlipidemia.  CT of the chest previously showed a moderate pericardial effusion and mild pericardial thickening.  She was last seen 03/07/2023.  Subsequent echocardiogram 06/02/2023 again showed moderate pericardial effusion circumferential no findings of hemodynamic compromise or tamponade and by my measurements anterior was 14 mm posterior 11 mm.  The effusion was simple normal left and right ventricular function.  Inflammatory markers CRP and sedimentation rate were normal.  She is on colchicine  and NSAID therapy.  She was seen by my partner 01/30/2024 with a notation from him that she had an echocardiogram from [facility] performed and was continued on colchicine  therapy.  An echocardiogram was performed and 11/13/2023 showing a moderate circumferential  pericardial effusion.  Unfortunately the dimensions are not described there is no evidence of hemodynamic compromise or tamponade.  It was an outpatient study that says the indication for the test was bradycardia.  Compliance with diet, lifestyle and medications: Yes  From a cardiology perspective she is doing well she is not having shortness of breath edema chest pain palpitation or syncope She engages in sports pickleball without restriction She tolerates colchicine  I think she should remain on it with her chronic pericardial disease I reviewed her last echocardiogram and no indication for pericardial drainage the previous was more mild to moderate than moderate to severe 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    Current Medications: No outpatient medications have been marked as taking for the 04/15/24 encounter (Appointment) with Leiter Redell PARAS, MD.      EKGs/Labs/Other Studies Reviewed:    The following studies were reviewed today:  Cardiac Studies & Procedures   ______________________________________________________________________________________________     ECHOCARDIOGRAM  ECHOCARDIOGRAM COMPLETE 06/02/2023  Narrative ECHOCARDIOGRAM REPORT    Patient Name:   Alyssa Ortiz Date of Exam: 06/02/2023 Medical Rec #:  [id number]             Height:       67.0 in Accession #:  [id number]            Weight:       210.0 lb Date of Birth:  February 02, 1956              BSA:          2.065 m Patient Age:    89 years              BP:           116/78 mmHg Patient Gender: F                     HR:           67 bpm. Exam Location:   [place]  Procedure: 2D Echo, Cardiac Doppler, Color Doppler and Strain Analysis  Indications:    Pericardial effusion [I31.39 (ICD-10-CM)]; Essential hypertension [I10 (ICD-10-CM)]; Mixed hyperlipidemia [E78.2 (ICD-10-CM)]  History:        Patient has prior history of Echocardiogram examinations, most recent 10/13/2021. Risk Factors:Hypertension and Dyslipidemia. Pericardial effusion [I31.39]. Essential hypertension [I10]. Mixed hyperlipidemia [E78.2].  Sonographer:    Charlie Jointer RDCS Referring Phys: [id number] Emilly Lavey J Francys Bolin  IMPRESSIONS   1. Left ventricular ejection fraction, by estimation, is 60 to 65%. The left ventricle has normal function. The left ventricle has no regional wall motion abnormalities. There is mild left ventricular hypertrophy. Left ventricular diastolic parameters were normal. The average left ventricular global longitudinal strain is 16.8 %. 2. Right ventricular systolic function is normal. The right ventricular size is normal. There is normal pulmonary artery systolic pressure. 3. My measuements anterior 14 mm posterior 11 mm pericardial effusion Moderate pericardial thickening. Moderate pericardial effusion. The pericardial effusion is circumferential. There are no findings of hemodynamic compromise. There is no evidence of cardiac tamponade. 4. The mitral valve is degenerative. No evidence of mitral valve regurgitation. No evidence of mitral stenosis. 5. The aortic valve is tricuspid. Aortic valve regurgitation is not visualized. No aortic stenosis is present. 6. Aortic Normal DTA. 7. The inferior vena cava is normal in size with greater than 50% respiratory variability, suggesting right atrial pressure of 3 mmHg.  FINDINGS Left Ventricle: Left ventricular ejection fraction, by estimation, is 60 to 65%. The left ventricle has normal function. The left ventricle has no regional wall motion abnormalities. The average left ventricular global longitudinal  strain is 16.8 %. The left ventricular internal cavity size was normal in size. There is mild left ventricular hypertrophy. Left ventricular diastolic parameters were normal. Normal left ventricular filling pressure.  Right Ventricle: The right ventricular size is normal. No increase in right ventricular wall thickness. Right ventricular systolic function is normal. There is normal pulmonary artery systolic pressure. The tricuspid regurgitant velocity is 2.14 m/s, and with an assumed right atrial pressure of 3 mmHg, the estimated right ventricular systolic pressure is 21.3 mmHg.  Left Atrium: Left atrial size was normal in size.  Right Atrium: Right atrial size was normal in size.  Pericardium: My measuements anterior 14 mm posterior 11 mm pericardial effusion  Moderate pericardial thickening. A moderately sized pericardial effusion is present. The pericardial effusion is circumferential. There is No findings of hemodynamic compromise. There is no evidence of cardiac tamponade. Thickening/calcification of pericardium present.  Mitral Valve: The mitral valve is degenerative in appearance. Mild mitral annular calcification. No evidence of mitral valve regurgitation. No evidence of mitral valve stenosis.  Tricuspid Valve: The tricuspid valve is normal in structure. Tricuspid valve regurgitation is trivial. No evidence of tricuspid  stenosis.  Aortic Valve: The aortic valve is tricuspid. Aortic valve regurgitation is not visualized. No aortic stenosis is present.  Pulmonic Valve: The pulmonic valve was normal in structure. Pulmonic valve regurgitation is not visualized. No evidence of pulmonic stenosis.  Aorta: The aortic arch was not well visualized, the aortic root and ascending aorta are structurally normal, with no evidence of dilitation and Normal DTA.  Venous: The pulmonary veins were not well visualized. The inferior vena cava is normal in size with greater than 50% respiratory  variability, suggesting right atrial pressure of 3 mmHg.  IAS/Shunts: No atrial level shunt detected by color flow Doppler.  Additional Comments: There is a small pleural effusion in the left lateral region.   LEFT VENTRICLE PLAX 2D LVIDd:         5.20 cm   Diastology LVIDs:         3.30 cm   LV e' medial:    8.27 cm/s LV PW:         1.00 cm   LV E/e' medial:  10.1 LV IVS:        0.90 cm   LV e' lateral:   8.27 cm/s LVOT diam:     1.90 cm   LV E/e' lateral: 10.1 LV SV:         67 LV SV Index:   32        2D Longitudinal Strain LVOT Area:     2.84 cm  2D Strain GLS Avg:     16.8 %   RIGHT VENTRICLE             IVC RV Basal diam:  3.10 cm     IVC diam: 1.40 cm RV Mid diam:    3.30 cm RV S prime:     13.60 cm/s TAPSE (M-mode): 1.8 cm  LEFT ATRIUM             Index        RIGHT ATRIUM           Index LA diam:        4.50 cm 2.18 cm/m   RA Area:     14.40 cm LA Vol (A2C):   60.3 ml 29.21 ml/m  RA Volume:   34.00 ml  16.47 ml/m LA Vol (A4C):   63.5 ml 30.76 ml/m LA Biplane Vol: 64.9 ml 31.43 ml/m AORTIC VALVE LVOT Vmax:   106.00 cm/s LVOT Vmean:  71.800 cm/s LVOT VTI:    0.235 m  AORTA Ao Root diam: 3.30 cm Ao Asc diam:  3.00 cm Ao Desc diam: 2.50 cm  MITRAL VALVE               TRICUSPID VALVE MV Area (PHT): 2.91 cm    TR Peak grad:   18.3 mmHg MV Decel Time: 261 msec    TR Vmax:        214.00 cm/s MV E velocity: 83.30 cm/s MV A velocity: 88.00 cm/s  SHUNTS MV E/A ratio:  0.95        Systemic VTI:  0.24 m Systemic Diam: 1.90 cm  Redell Leiter MD Electronically signed by Redell Leiter MD Signature Date/Time: 06/04/2023/9:57:17 AM    Final        CARDIAC MRI  MR CARDIAC MORPHOLOGY W WO CONTRAST 09/17/2021  Narrative CLINICAL DATA:  Clinical question of pericardial effusion Study assumes HCT of 43 and BSA of 2.21 m2.  EXAM: CARDIAC MRI  TECHNIQUE: The patient was scanned on a  1.5 Tesla GE magnet. A dedicated cardiac coil was used. Functional imaging  was done using Fiest[employer] sequences. 2,3, and 4 chamber views were done to assess for RWMA's. Modified Simpson's rule using a short axis stack was used to calculate an ejection fraction on a dedicated work [employer]. The patient received 10 cc of Gadavist . After 10 minutes inversion recovery sequences were used to assess for infiltration and scar tissue.  CONTRAST:  10 cc  of Gadavist   FINDINGS: 1. Normal left ventricular size, with LVEDD 52 mm, and LVEDVi 64 mL/m2.  Normal left ventricular thickness, with intraventricular septal thickness of 9 mm, posterior wall thickness of 7 mm, and myocardial mass index of 38 g/m2.  Normal left ventricular systolic function (LVEF =61%). There are no regional wall motion abnormalities.  Left ventricular parametric mapping notable for normal T2 and ECV signal.  There is no late gadolinium enhancement in the left ventricular myocardium.  2. Normal right ventricular size with RVEDVI 72 mL/m2.  Normal right ventricular thickness.  Normal right ventricular systolic function (RVEF =54%). There are no regional wall motion abnormalities or aneurysms.  3.  Normal left and right atrial size.  4. Normal size of the aortic root, ascending aorta and pulmonary artery.  5. Valve assessment:  Aortic Valve: Tri-leaflet aortic valve. Qualitatively, there is no significant regurgitation.  Pulmonic Valve: Qualitatively, there is no significant regurgitation.  Tricuspid Valve: Qualitatively, there is no significant regurgitation.  Mitral Valve: Qualitatively, mild central regurgitation.  6. No pericardial enhancement. There is mild pericardial thickening pericardium- 4 mm on black blood imaging overlying the anterior RV pericardium.  There is a large pericardial effusion. Largest over the basal anterior LV 26 mm. Circumferential effusion.  There is no evidence of ventricular interdependence. There is no hepatic flow reversal.  There is no evidence RV or RA collapse. There is no IVC dilation.  7. Grossly, no extracardiac findings. Recommended dedicated study if concerned for non-cardiac pathology.  8. Notable breathhold and wrap around artifacts noted. This decreased the sensitivity of volumetric assessment of valve disease.  IMPRESSION: Large, circumferential pericardial effusion with preserved LV and RV function.  Stanly Leavens MD   Electronically Signed By: Stanly Leavens M.D. On: 09/19/2021 17:12   ______________________________________________________________________________________________          Recent Labs: 11/10/2023: ALT 18; BUN 18; Creatinine, Ser 0.77; NT-Pro BNP 89; Potassium 4.0; Sodium 142  03/05/2024 cholesterol 168 LDL 92 A1c 5.5 hemoglobin 86.3 creatinine 0.91 potassium 4.2  Physical Exam:    VS:  There were no vitals taken for this visit.    Wt Readings from Last 3 Encounters:  01/10/24 217 lb 6.4 oz (98.6 kg)  11/10/23 217 lb (98.4 kg)  05/28/23 210 lb (95.3 kg)     GEN:  Well nourished, well developed in no acute distress HEENT: Normal NECK: No JVD; No carotid bruits LYMPHATICS: No lymphadenopathy CARDIAC: RRR, no murmurs, rubs, gallops RESPIRATORY:  Clear to auscultation without rales, wheezing or rhonchi  ABDOMEN: Soft, non-tender, non-distended MUSCULOSKELETAL:  No edema; No deformity  SKIN: Warm and dry NEUROLOGIC:  Alert and oriented x 3 PSYCHIATRIC:  Normal affect    Signed, Redell Leiter, MD  04/14/2024 10:22 AM    [facility]

## 2024-04-15 ENCOUNTER — Ambulatory Visit: Attending: Cardiology | Admitting: Cardiology

## 2024-04-15 ENCOUNTER — Encounter: Payer: Self-pay | Admitting: Cardiology

## 2024-04-15 VITALS — BP 112/74 | HR 89 | Ht 67.0 in | Wt 226.4 lb

## 2024-04-15 DIAGNOSIS — I10 Essential (primary) hypertension: Secondary | ICD-10-CM

## 2024-04-15 DIAGNOSIS — I3139 Other pericardial effusion (noninflammatory): Secondary | ICD-10-CM | POA: Diagnosis not present

## 2024-04-15 DIAGNOSIS — E782 Mixed hyperlipidemia: Secondary | ICD-10-CM | POA: Diagnosis not present

## 2024-04-15 NOTE — Patient Instructions (Addendum)
 Medication Instructions:  Your physician recommends that you continue on your current medications as directed. Please refer to the Current Medication list given to you today.  *If you need a refill on your cardiac medications before your next appointment, please call your pharmacy*  Lab Work: None If you have labs (blood work) drawn today and your tests are completely normal, you will receive your results only by: MyChart Message (if you have MyChart) OR A paper copy in the mail If you have any lab test that is abnormal or we need to change your treatment, we will call you to review the results.  Testing/Procedures: Your physician has requested that you have an echocardiogram. Echocardiography is a painless test that uses sound waves to create images of your heart. It provides your doctor with information about the size and shape of your heart and how well your heart's chambers and valves are working. This procedure takes approximately one hour. There are no restrictions for this procedure. Please do NOT wear cologne, perfume, aftershave, or lotions (deodorant is allowed). Please arrive 15 minutes prior to your appointment time.  Please note: We ask at that you not bring children with you during ultrasound (echo/ vascular) testing. Due to room size and safety concerns, children are not allowed in the ultrasound rooms during exams. Our front office staff cannot provide observation of children in our lobby area while testing is being conducted. An adult accompanying a patient to their appointment will only be allowed in the ultrasound room at the discretion of the ultrasound technician under special circumstances. We apologize for any inconvenience.   Follow-Up: At [facility], you and your health needs are our priority.  As part of our continuing mission to provide you with exceptional heart care, our providers are all part of one team.  This team includes your primary Cardiologist  (physician) and Advanced Practice Providers or APPs (Physician Assistants and Nurse Practitioners) who all work together to provide you with the care you need, when you need it.  Your next appointment:   1 year(s)  Provider:   Redell Leiter, MD    We recommend signing up for the patient portal called MyChart.  Sign up information is provided on this After Visit Summary.  MyChart is used to connect with patients for Virtual Visits (Telemedicine).  Patients are able to view lab/test results, encounter notes, upcoming appointments, etc.  Non-urgent messages can be sent to your provider as well.   To learn more about what you can do with MyChart, go to [email or web address].   Other Instructions None

## 2024-06-19 ENCOUNTER — Ambulatory Visit (HOSPITAL_BASED_OUTPATIENT_CLINIC_OR_DEPARTMENT_OTHER)
Admission: EM | Admit: 2024-06-19 | Discharge: 2024-06-19 | Disposition: A | Discharge status: Home / Self Care | Attending: Family Medicine | Admitting: Family Medicine

## 2024-06-19 ENCOUNTER — Encounter (HOSPITAL_BASED_OUTPATIENT_CLINIC_OR_DEPARTMENT_OTHER): Payer: Self-pay

## 2024-06-19 ENCOUNTER — Other Ambulatory Visit (HOSPITAL_BASED_OUTPATIENT_CLINIC_OR_DEPARTMENT_OTHER): Payer: Self-pay

## 2024-06-19 DIAGNOSIS — B9789 Other viral agents as the cause of diseases classified elsewhere: Secondary | ICD-10-CM | POA: Diagnosis not present

## 2024-06-19 DIAGNOSIS — R051 Acute cough: Secondary | ICD-10-CM | POA: Diagnosis not present

## 2024-06-19 DIAGNOSIS — J019 Acute sinusitis, unspecified: Secondary | ICD-10-CM | POA: Diagnosis not present

## 2024-06-19 MED ORDER — PREDNISONE 20 MG PO TABS
20.0000 mg | ORAL_TABLET | Freq: Every day | ORAL | 0 refills | Status: AC
  Filled 2024-06-19: qty 5, 5d supply, fill #0

## 2024-06-19 MED ORDER — PROMETHAZINE-DM 6.25-15 MG/5ML PO SYRP
5.0000 mL | ORAL_SOLUTION | Freq: Four times a day (QID) | ORAL | 0 refills | Status: AC | PRN
  Filled 2024-06-19: qty 118, 6d supply, fill #0

## 2024-06-19 NOTE — Discharge Instructions (Addendum)
 Viral sinusitis with cough: Encouraged sinus rinses with the sinus rinse nasal system.  Prednisone  20 mg daily for 5 days.  May use Promethazine  DM, 5 mL, every 6 hours if needed for cough.  Promethazine  DM could be sedating so do not use and drive.  Get plenty of fluids and rest.  Follow-up if symptoms do not improve, if symptoms worsen or if new symptoms occur.

## 2024-06-19 NOTE — ED Triage Notes (Signed)
 Pt c/o nasal congestion, cough, facial pain, HA, occ feeling like her ears are burning, sore throat-worst in the morning, and upper back soreness for the last 7 days. She has taken day and night cold meds with slight relief.

## 2024-06-19 NOTE — ED Provider Notes (Signed)
 " [facility]    CSN: [id number] Arrival date & time: 06/19/24  1017      History   Chief Complaint Chief Complaint  Patient presents with   Nasal Congestion    HPI Alyssa Ortiz is a 69 y.o. female.   69 year old female here with her husband.  Patient reports nasal congestion cough, headache, facial pain or pressure and even some intermittent fullness in her ears or pressure in her ears, upper back pain with cough and sore throat.  Symptoms are worse in the mornings.  Symptoms have been present since 06/13/2024 or even earlier.  She has taken day and nighttime cough and cold medicines with poor relief of symptoms.     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      Home Medications    Prior to Admission medications  Medication Sig Start Date End Date Taking? Authorizing Provider  predniSONE  (DELTASONE ) 20 MG tablet Take 1 tablet (20 mg total) by mouth daily with breakfast for 5  days. 06/19/24 06/24/24 Yes Ival Domino, FNP  promethazine -dextromethorphan (PROMETHAZINE -DM) 6.25-15 MG/5ML syrup Take 5 mLs by mouth 4 (four) times daily as needed for cough. Do not use and drive - May make drowsy. 06/19/24  Yes Ival Domino, FNP  buPROPion (WELLBUTRIN XL) 150 MG 24 hr tablet Take 450 mg by mouth daily. Patient taking differently: Take 300 mg by mouth daily.    [provider]  colchicine  0.6 MG tablet Take 1 tablet (0.6 mg total) by mouth daily. Please take this medication 3 times per week for 2 weeks then switch to daily. 04/03/24   Monetta Redell PARAS, MD  meloxicam  (MOBIC ) 15 MG tablet Take 1 tablet (15 mg total) by mouth daily as needed for pain. 02/15/24   Ival Domino, FNP  potassium chloride (KLOR-CON) 10 MEQ tablet Take 10 mEq by mouth daily. 07/30/21   [provider]  rosuvastatin (CRESTOR) 20 MG tablet Take 20 mg by mouth daily. 07/30/21   [provider]  torsemide  (DEMADEX ) 20 MG tablet TAKE ONE TABLET BY MOUTH DAILY 03/27/24   Revankar, Jennifer SAUNDERS, MD    Family History Family History  Problem Relation Age of Onset   Alzheimer's disease Mother    Healthy Father    Heart Problems Maternal Grandmother        Tumor in heart   Congestive Heart Failure Maternal Grandfather        smoker   Other Paternal Grandmother 46  Clemens broke neck   Liver cancer Paternal Grandfather     Social History Social History[1]   Allergies   Patient has no known allergies.   Review of Systems Review of Systems  Constitutional:  Negative for chills and fever.  HENT:  Positive for congestion, postnasal drip, rhinorrhea, sinus pressure and sinus pain. Negative for ear pain and sore throat.   Eyes:  Negative for pain and visual disturbance.  Respiratory:  Positive for cough. Negative for shortness of breath.   Cardiovascular:  Negative for chest pain and palpitations.  Gastrointestinal:  Negative for abdominal pain, constipation, diarrhea, nausea and  vomiting.  Genitourinary:  Negative for dysuria and hematuria.  Musculoskeletal:  Negative for arthralgias and back pain.  Skin:  Negative for color change and rash.  Neurological:  Negative for seizures and syncope.  All other systems reviewed and are negative.    Physical Exam Triage Vital Signs ED Triage Vitals  Encounter Vitals Group     BP 06/19/24 1201 (!) 148/74     Girls Systolic BP Percentile --      Girls Diastolic BP Percentile --      Boys Systolic BP Percentile --      Boys Diastolic BP Percentile --      Pulse Rate 06/19/24 1201 63     Resp 06/19/24 1201 20     Temp 06/19/24 1201 98.5 F (36.9 C)     Temp Source 06/19/24 1201 Oral     SpO2 06/19/24 1201 95 %     Weight --      Height --      Head Circumference --      Peak Flow --      Pain Score 06/19/24 1200 3     Pain Loc --      Pain Education --      Exclude from Growth Chart --    No data found.  Updated Vital Signs BP (!) 148/74 (BP Location: Right Arm)   Pulse 63   Temp 98.5 F (36.9 C) (Oral)   Resp 20   SpO2 95%   Visual Acuity Right Eye Distance:   Left Eye Distance:   Bilateral Distance:    Right Eye Near:   Left Eye Near:    Bilateral Near:     Physical Exam Vitals and nursing note reviewed.  Constitutional:      General: She is not in acute distress.    Appearance: She is well-developed. She is not ill-appearing, toxic-appearing or diaphoretic.  HENT:     Head: Normocephalic and atraumatic.     Right Ear: Hearing, tympanic membrane, ear canal and external ear normal.     Left Ear: Hearing, tympanic membrane, ear canal and external ear normal.     Nose: Congestion and rhinorrhea present. Rhinorrhea is clear.     Right Sinus: Maxillary sinus tenderness and frontal sinus tenderness present.     Left Sinus: Maxillary sinus tenderness and frontal sinus tenderness present.     Mouth/Throat:     Lips: Pink.     Mouth: Mucous membranes are moist.     Pharynx: Uvula midline. No  oropharyngeal exudate or posterior oropharyngeal erythema.     Tonsils: No tonsillar exudate.  Eyes:     Conjunctiva/sclera: Conjunctivae normal.     Pupils: Pupils are equal, round, and reactive to light.  Cardiovascular:     Rate and Rhythm: Normal rate and regular rhythm.     Heart sounds: S1 normal and  S2 normal. No murmur heard. Pulmonary:     Effort: Pulmonary effort is normal. No respiratory distress.     Breath sounds: Normal breath sounds. No decreased breath sounds, wheezing, rhonchi or rales.  Abdominal:     General: Bowel sounds are normal.     Palpations: Abdomen is soft.     Tenderness: There is no abdominal tenderness.  Musculoskeletal:        General: No swelling.     Cervical back: Neck supple.  Lymphadenopathy:     Head:     Right side of head: No submental, submandibular, tonsillar, preauricular or posterior auricular adenopathy.     Left side of head: No submental, submandibular, tonsillar, preauricular or posterior auricular adenopathy.     Cervical: Cervical adenopathy present.     Right cervical: Superficial cervical adenopathy present.     Left cervical: Superficial cervical adenopathy present.  Skin:    General: Skin is warm and dry.     Capillary Refill: Capillary refill takes less than 2 seconds.     Findings: No rash.  Neurological:     Mental Status: She is alert and oriented to person, place, and time.  Psychiatric:        Mood and Affect: Mood normal.      UC Treatments / Results  Labs (all labs ordered are listed, but only abnormal results are displayed) Labs Reviewed - No data to display  EKG   Radiology No results found.  Procedures Procedures (including critical care time)  Medications Ordered in UC Medications - No data to display  Initial Impression / Assessment and Plan / UC Course  I have reviewed the triage vital signs and the nursing notes.  Pertinent labs & imaging results that were available during my care of the  patient were reviewed by me and considered in my medical decision making (see chart for details).  Plan of Care (see discharge instructions for additional patient precautions and education): Viral sinusitis with cough: Encouraged sinus rinses with the sinus rinse nasal system.  Prednisone  20 mg daily for 5 days.  May use Promethazine  DM, 5 mL, every 6 hours if needed for cough.  Promethazine  DM could be sedating so do not use and drive.  Get plenty of fluids and rest.  Follow-up if symptoms do not improve, if symptoms worsen or if new symptoms occur.  I reviewed the plan of care with the patient and/or the patient's guardian.  The patient and/or guardian had time to ask questions and acknowledged that the questions were answered.  Final Clinical Impressions(s) / UC Diagnoses   Final diagnoses:  Acute viral sinusitis  Acute cough     Discharge Instructions      Viral sinusitis with cough: Encouraged sinus rinses with the sinus rinse nasal system.  Prednisone  20 mg daily for 5 days.  May use Promethazine  DM, 5 mL, every 6 hours if needed for cough.  Promethazine  DM could be sedating so do not use and drive.  Get plenty of fluids and rest.  Follow-up if symptoms do not improve, if symptoms worsen or if new symptoms occur.     ED Prescriptions     Medication Sig Dispense Auth. Provider   predniSONE  (DELTASONE ) 20 MG tablet Take 1 tablet (20 mg total) by mouth daily with breakfast for 5 days. 5 tablet Ammanda Dobbins, FNP   promethazine -dextromethorphan (PROMETHAZINE -DM) 6.25-15 MG/5ML syrup Take 5 mLs by mouth 4 (four) times daily as needed for cough. Do not use and  drive - May make drowsy. 118 mL Ival Domino, FNP      PDMP not reviewed this encounter.    [1]  Social History Tobacco Use   Smoking status: Never    Passive exposure: Past   Smokeless tobacco: Never  Vaping Use   Vaping status: Never Used  Substance Use Topics   Alcohol use: Yes   Drug use: Never     Ival Domino, FNP 06/19/24 1249  "

## 2024-07-04 ENCOUNTER — Encounter: Payer: Self-pay | Admitting: Cardiology

## 2024-07-12 ENCOUNTER — Encounter (HOSPITAL_BASED_OUTPATIENT_CLINIC_OR_DEPARTMENT_OTHER): Payer: Self-pay

## 2024-07-12 ENCOUNTER — Ambulatory Visit (HOSPITAL_BASED_OUTPATIENT_CLINIC_OR_DEPARTMENT_OTHER)
Admission: RE | Admit: 2024-07-12 | Discharge: 2024-07-12 | Disposition: A | Discharge status: Home / Self Care | Source: Ambulatory Visit

## 2024-07-12 ENCOUNTER — Other Ambulatory Visit (HOSPITAL_BASED_OUTPATIENT_CLINIC_OR_DEPARTMENT_OTHER): Payer: Self-pay

## 2024-07-12 ENCOUNTER — Ambulatory Visit (HOSPITAL_BASED_OUTPATIENT_CLINIC_OR_DEPARTMENT_OTHER): Admit: 2024-07-12 | Admitting: Radiology

## 2024-07-12 VITALS — BP 124/84 | HR 80 | Temp 97.9°F | Resp 20

## 2024-07-12 DIAGNOSIS — R051 Acute cough: Secondary | ICD-10-CM

## 2024-07-12 DIAGNOSIS — J0120 Acute ethmoidal sinusitis, unspecified: Secondary | ICD-10-CM

## 2024-07-12 MED ORDER — AMOXICILLIN-POT CLAVULANATE 875-125 MG PO TABS
1.0000 | ORAL_TABLET | Freq: Two times a day (BID) | ORAL | 0 refills | Status: AC
  Filled 2024-07-12: qty 20, 10d supply, fill #0

## 2024-07-12 NOTE — ED Triage Notes (Signed)
 Pt c/o cough, facial pain, and nasal congestion- worse in the morning since January. She was seen on 06/19/24 and was given prednisone  and a cough med. She had started to feel some better but then symptoms came back and she started to run a fever last week.

## 2024-07-12 NOTE — Discharge Instructions (Addendum)
 Return if any problems.
